# Patient Record
Sex: Male | Born: 1960 | Race: Black or African American | Hispanic: No | Marital: Single | Smoking: Current every day smoker
Health system: Southern US, Community
[De-identification: ages and names within clinical notes are randomized; demographics above are authoritative.]

## PROBLEM LIST (undated history)

## (undated) DIAGNOSIS — I10 Essential (primary) hypertension: Secondary | ICD-10-CM

## (undated) DIAGNOSIS — K219 Gastro-esophageal reflux disease without esophagitis: Secondary | ICD-10-CM

## (undated) HISTORY — DX: Gastro-esophageal reflux disease without esophagitis: K21.9

## (undated) HISTORY — DX: Essential (primary) hypertension: I10

---

## 2004-07-05 HISTORY — PX: WRIST ARTHROPLASTY: SHX1088

## 2009-07-01 ENCOUNTER — Ambulatory Visit: Payer: Self-pay | Admitting: Diagnostic Radiology

## 2009-07-01 ENCOUNTER — Emergency Department (HOSPITAL_BASED_OUTPATIENT_CLINIC_OR_DEPARTMENT_OTHER): Admission: EM | Admit: 2009-07-01 | Discharge: 2009-07-01 | Payer: Self-pay | Admitting: Emergency Medicine

## 2012-01-10 ENCOUNTER — Emergency Department (HOSPITAL_BASED_OUTPATIENT_CLINIC_OR_DEPARTMENT_OTHER): Payer: 59

## 2012-01-10 ENCOUNTER — Encounter (HOSPITAL_BASED_OUTPATIENT_CLINIC_OR_DEPARTMENT_OTHER): Payer: Self-pay | Admitting: *Deleted

## 2012-01-10 ENCOUNTER — Emergency Department (HOSPITAL_BASED_OUTPATIENT_CLINIC_OR_DEPARTMENT_OTHER)
Admission: EM | Admit: 2012-01-10 | Discharge: 2012-01-11 | Disposition: A | Discharge status: Home / Self Care | Payer: 59 | Attending: Emergency Medicine | Admitting: Emergency Medicine

## 2012-01-10 DIAGNOSIS — F17200 Nicotine dependence, unspecified, uncomplicated: Secondary | ICD-10-CM | POA: Insufficient documentation

## 2012-01-10 DIAGNOSIS — R0602 Shortness of breath: Secondary | ICD-10-CM | POA: Insufficient documentation

## 2012-01-10 DIAGNOSIS — R079 Chest pain, unspecified: Secondary | ICD-10-CM | POA: Insufficient documentation

## 2012-01-10 DIAGNOSIS — M549 Dorsalgia, unspecified: Secondary | ICD-10-CM | POA: Insufficient documentation

## 2012-01-10 DIAGNOSIS — R091 Pleurisy: Secondary | ICD-10-CM | POA: Insufficient documentation

## 2012-01-10 LAB — CBC WITH DIFFERENTIAL/PLATELET
Basophils Absolute: 0 10*3/uL (ref 0.0–0.1)
Eosinophils Relative: 2 % (ref 0–5)
Lymphocytes Relative: 36 % (ref 12–46)
MCV: 97.2 fL (ref 78.0–100.0)
Platelets: 191 10*3/uL (ref 150–400)
RDW: 13.2 % (ref 11.5–15.5)
WBC: 8.1 10*3/uL (ref 4.0–10.5)

## 2012-01-10 LAB — BASIC METABOLIC PANEL
CO2: 27 mEq/L (ref 19–32)
Calcium: 9.6 mg/dL (ref 8.4–10.5)
GFR calc Af Amer: >90 mL/min (ref >90)
GFR calc non Af Amer: 87 mL/min — ABNORMAL LOW (ref >90)
Sodium: 138 mEq/L (ref 135–145)

## 2012-01-10 LAB — TROPONIN I: Troponin I: <0.3 ng/mL (ref <0.30)

## 2012-01-10 LAB — D-DIMER, QUANTITATIVE: D-Dimer, Quant: <0.22 ug/mL-FEU (ref 0.00–0.48)

## 2012-01-10 NOTE — ED Notes (Signed)
Pt. Reports no nausea or vomiting. 

## 2012-01-10 NOTE — ED Notes (Signed)
Patient states that when he inhales he has a pain in his chest on the right side, also when he coughs he has pain in his upper back. States that he hasn't been sick.

## 2012-01-10 NOTE — ED Provider Notes (Addendum)
History  This chart was scribed for Jonathan Saulters [name], MD by Jonathan Barry. This patient was seen in room MH08/MH08 and the patient's care was started at 11:56PM.  CSN: [id number]  Arrival date & time 01/10/12  1954   First MD Initiated Contact with Patient 01/10/12 2256      Chief Complaint  Patient presents with  . Pleurisy    Patient is a 51 y.o. male presenting with chest pain. The history is provided by the patient. No language interpreter was used.  Chest Pain The chest pain began 12 - 24 hours ago. Duration of episode(s) is 18 hours. Chest pain occurs constantly. The chest pain is unchanged. The pain is associated with breathing and coughing. At its most intense, the pain is at 8/10. The pain is currently at 8/10. The severity of the pain is severe. The quality of the pain is described as sharp (right chest straight through). The pain radiates to the mid back. Chest pain is worsened by deep breathing. Pertinent negatives for primary symptoms include no fever, no shortness of breath, no cough, no wheezing, no palpitations, no abdominal pain, no nausea, no vomiting, no dizziness and no altered mental status.  Pertinent negatives for associated symptoms include no numbness and no weakness. He tried NSAIDs for the symptoms. Risk factors include male gender.  Pertinent negatives for past medical history include no MI.  Procedure history is negative for cardiac catheterization.    Jonathan Barry is a 51 y.o. male who presents to the Emergency Department complaining of approximately 18 hours of gradual onset, gradually worsening, constant left sided chest pain that he woke up with. The chest pain is described as sharp and non-radiating. The pain is worse with deep breathing and rated a 7 or 8 out of 10 currently. He reports taking "pain away" and his GERD medications without improvement in his symptoms. He denies having prior episodes of similar symptoms. He denies any recent long distance  car/plane travels. He denies fever, leg swelling, neck pain, sore throat, visual disturbance, CP, cough, SOB, abdominal pain, nausea, emesis, diarrhea, urinary symptoms, back pain, HA, weakness, numbness and rash as associated symptoms.  He is a current everyday smoker and occasional alcohol user.   History reviewed. No pertinent past medical history.  History reviewed. No pertinent past surgical history.  No family history on file.  History  Substance Use Topics  . Smoking status: Current Everyday Smoker  . Smokeless tobacco: Not on file  . Alcohol Use: Yes      Review of Systems  Constitutional: Negative for fever and chills.  HENT: Negative for congestion, sore throat and neck pain.   Eyes: Negative for visual disturbance.  Respiratory: Negative for cough, chest tightness, shortness of breath and wheezing.   Cardiovascular: Positive for chest pain. Negative for palpitations.  Gastrointestinal: Negative for nausea, vomiting, abdominal pain and diarrhea.  Genitourinary: Negative for dysuria, urgency and hematuria.  Musculoskeletal: Negative for back pain.  Skin: Negative for rash.  Neurological: Negative for dizziness, weakness, numbness and headaches.  Psychiatric/Behavioral: Negative for altered mental status.  All other systems reviewed and are negative.    Allergies  Review of patient's allergies indicates no known allergies.  Home Medications  No current outpatient prescriptions on file.  Triage Vitals: BP 156/99  Pulse 91  Temp 98.6 F (37 C) (Oral)  Resp 18  SpO2 99%  Physical Exam  Nursing note and vitals reviewed. Constitutional: He is oriented to person, place, and time. He appears  well-developed and well-nourished. No distress.  HENT:  Head: Normocephalic and atraumatic.  Mouth/Throat: No oropharyngeal exudate.       Moist mucus membranes  Eyes: Conjunctivae and EOM are normal. Pupils are equal, round, and reactive to light. No scleral icterus.    Neck: Normal range of motion. Neck supple.  Cardiovascular: Normal rate, regular rhythm and normal heart sounds.  Exam reveals no gallop and no friction rub.   No murmur heard. Pulmonary/Chest: Effort normal and breath sounds normal. No respiratory distress. He has no wheezes. He has no rales. He exhibits no tenderness.  Abdominal: Soft. Bowel sounds are normal. He exhibits no distension. There is no tenderness. There is no rebound.  Musculoskeletal: Normal range of motion. He exhibits no tenderness.  Lymphadenopathy:    He has no cervical adenopathy.  Neurological: He is alert and oriented to person, place, and time. No cranial nerve deficit.  Skin: Skin is warm and dry.  Psychiatric: He has a normal mood and affect. His behavior is normal.    ED Course  Procedures (including critical care time)  DIAGNOSTIC STUDIES: Oxygen Saturation is 99% on room air, normal by my interpretation.    COORDINATION OF CARE: 11:05PM-Discussed treatment plan which includes blood work with pt at bedside and pt agreed to plan. Informed pt of normal CXR and EKG and pt acknowledged results.    Date: 01/10/2012  Rate: 82  Rhythm: normal sinus rhythm  QRS Axis: normal  Intervals: normal  ST/T Wave abnormalities: normal and nonspecific ST/T changes  Conduction Disutrbances:none  Narrative Interpretation: early repolarization   Old EKG Reviewed: none available   Labs Reviewed - No data to display Dg Chest 2 View  01/10/2012  *RADIOLOGY REPORT*  Clinical Data: Right-sided chest pain, shortness of breath  CHEST - 2 VIEW  Comparison: None.  Findings: No active infiltrate or effusion is seen.  Mediastinal contours appear normal.  The heart is within normal limits in size. No bony abnormality is seen.  IMPRESSION: No active lung disease.  Original Report Authenticated By: Juline Patch, M.D.    No diagnosis found.    MDM  Constant pleuritic CP x greater than 12 hours with one normal EKG and one troponin  sufficient to diagnosis as ACS.  Negative DDIMEr no need for further imaging symptoms consistent with pleurisy. Return if develop fevers, SOB, emesis and diaphoresis. Advised pt to follow up with PCP.  Patient verbalizes understanding and agrees to follow up    I personally performed the services described in this documentation, which was scribed in my presence. The recorded information has been reviewed and considered.     Jonathan Awe, MD 01/11/12 0524  Jonathan Secord K Briget Shaheed-Rasch, MD 01/11/12 [phone]

## 2012-01-11 MED ORDER — NAPROXEN 375 MG PO TABS: 375.0000 mg | ORAL_TABLET | Freq: Two times a day (BID) | ORAL | Status: AC

## 2012-01-11 MED ORDER — KETOROLAC TROMETHAMINE 60 MG/2ML IM SOLN
60.0000 mg | Freq: Once | INTRAMUSCULAR | Status: AC
  Administered 2012-01-11: 60 mg via INTRAMUSCULAR
  Filled 2012-01-11: qty 2

## 2013-06-12 ENCOUNTER — Encounter: Payer: Self-pay | Admitting: Internal Medicine

## 2013-07-12 ENCOUNTER — Encounter: Payer: Self-pay | Admitting: Internal Medicine

## 2013-07-16 ENCOUNTER — Ambulatory Visit: Payer: 59 | Admitting: Internal Medicine

## 2013-08-01 ENCOUNTER — Encounter: Payer: Self-pay | Admitting: Internal Medicine

## 2013-08-28 ENCOUNTER — Encounter: Payer: Self-pay | Admitting: Internal Medicine

## 2013-08-29 ENCOUNTER — Ambulatory Visit: Payer: 59 | Admitting: Internal Medicine

## 2013-09-11 ENCOUNTER — Encounter: Payer: Self-pay | Admitting: Internal Medicine

## 2013-10-18 ENCOUNTER — Encounter: Payer: Self-pay | Admitting: Internal Medicine

## 2013-10-23 ENCOUNTER — Ambulatory Visit: Payer: 59 | Admitting: Internal Medicine

## 2013-11-03 ENCOUNTER — Encounter (HOSPITAL_BASED_OUTPATIENT_CLINIC_OR_DEPARTMENT_OTHER): Payer: Self-pay | Admitting: Emergency Medicine

## 2013-11-03 ENCOUNTER — Emergency Department (HOSPITAL_BASED_OUTPATIENT_CLINIC_OR_DEPARTMENT_OTHER): Payer: 59

## 2013-11-03 ENCOUNTER — Emergency Department (HOSPITAL_BASED_OUTPATIENT_CLINIC_OR_DEPARTMENT_OTHER)
Admission: EM | Admit: 2013-11-03 | Discharge: 2013-11-04 | Disposition: A | Discharge status: Home / Self Care | Payer: 59 | Attending: Emergency Medicine | Admitting: Emergency Medicine

## 2013-11-03 DIAGNOSIS — Z23 Encounter for immunization: Secondary | ICD-10-CM | POA: Insufficient documentation

## 2013-11-03 DIAGNOSIS — I10 Essential (primary) hypertension: Secondary | ICD-10-CM | POA: Insufficient documentation

## 2013-11-03 DIAGNOSIS — F17200 Nicotine dependence, unspecified, uncomplicated: Secondary | ICD-10-CM | POA: Insufficient documentation

## 2013-11-03 DIAGNOSIS — S82109A Unspecified fracture of upper end of unspecified tibia, initial encounter for closed fracture: Secondary | ICD-10-CM | POA: Insufficient documentation

## 2013-11-03 DIAGNOSIS — S82143A Displaced bicondylar fracture of unspecified tibia, initial encounter for closed fracture: Secondary | ICD-10-CM

## 2013-11-03 DIAGNOSIS — Z8719 Personal history of other diseases of the digestive system: Secondary | ICD-10-CM | POA: Insufficient documentation

## 2013-11-03 MED ORDER — HYDROCODONE-ACETAMINOPHEN 5-325 MG PO TABS
2.0000 | ORAL_TABLET | Freq: Once | ORAL | Status: AC
  Administered 2013-11-03: 2 via ORAL
  Filled 2013-11-03: qty 2
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
## 2013-11-04 MED ORDER — TETANUS-DIPHTH-ACELL PERTUSSIS 5-2.5-18.5 LF-MCG/0.5 IM SUSP
0.5000 mL | Freq: Once | INTRAMUSCULAR | Status: AC
  Administered 2013-11-04: 0.5 mL via INTRAMUSCULAR
  Filled 2013-11-04: qty 0.5

## 2013-11-04 MED ORDER — OXYCODONE-ACETAMINOPHEN 5-325 MG PO TABS: 2.0000 | ORAL_TABLET | ORAL | Status: DC | PRN

## 2013-11-04 NOTE — ED Notes (Addendum)
Rx x 1 given for percocet- crutches and ice pack given for home use- work note given- family member at bedside to drive

## 2013-11-04 NOTE — Discharge Instructions (Signed)
Knee Fracture, Adult °A knee fracture is a break in any of the bones of the lower part of the thigh bone, the upper part of the bones of the lower leg, or of the kneecap. When the bones no longer meet the way they are supposed to it is called a dislocation. Sometimes there can be a dislocation along with fractures. °SYMPTOMS  °Symptoms may include pain, swelling, inability to bend the knee, deformity of the knee, and inability to walk.  °DIAGNOSIS  °This problem is usually diagnosed with x-rays. Special studies are sometimes done if a fracture is suspected but cannot be seen on ordinary x-rays. If vessels around the knee are injured, special tests may be done to see what the damage is. °TREATMENT  °· The leg is usually splinted for the first couple of days to allow for swelling. After the swelling is down a cast is put on. Sometimes a cast is put on right away with the sides of the cast cut to allow the knee to swell. If the bones are in place, this may be all that is needed. °· If the bones are out of place, medications for pain are given to allow them to be put back in place. If they are seriously out of place, surgery may be needed to hold the pieces or breaks in place using wires, pins, screws or metal plates. °· Generally most fractures will heal in 4 to 6 weeks. °HOME CARE INSTRUCTIONS  °· Use your crutches as directed. °· To lessen the swelling, keep the injured leg elevated while sitting or lying down. °· Apply ice to the injury for 15-20 minutes, 03-04 times per day while awake for 2 days. Put the ice in a plastic bag and place a thin towel between the bag of ice and your cast. °· If you have a plaster or fiberglass cast: °· Do not try to scratch the skin under the cast using sharp or pointed objects. °· Check the skin around the cast every day. You may put lotion on any red or sore areas. °· Keep your cast dry and clean. °· If you have a plaster splint: °· Wear the splint as directed. °· You may loosen the  elastic around the splint if your toes become numb, tingle, or turn cold or blue. °· Do not put pressure on any part of your cast or splint; it may break. Rest your cast only on a pillow the first 24 hours until it is fully hardened. °· Your cast or splint can be protected during bathing with a plastic bag. Do not lower the cast or splint into water. °· Only take over-the-counter or prescription medicines for pain, discomfort, or fever as directed by your caregiver. °· See your caregiver soon if your cast gets damaged or breaks. °· It is very important to keep all follow up appointments. Not following up as directed may result in a worsening of your condition or a failure of the fracture to heal properly. °SEEK IMMEDIATE MEDICAL CARE IF: °· You have continued severe pain. °· You have more swelling than you did before the cast was put on. °· The area below the fracture becomes painful. °· Your skin or toenails below the injury turn blue or gray, or feel cold or numb. °· There is drainage coming from under the cast. °· New, unexplained symptoms develop (drugs used in treatment may produce side effects). °MAKE SURE YOU:  °· Understand these instructions. °· Will watch your condition. °· Will   get help right away if you are not doing well or get worse. °Document Released: 05/04/2006 Document Revised: 09/13/2011 Document Reviewed: 06/05/2007 °[employer]® Patient Information ©2014 [employer]. ° °

## 2013-11-04 NOTE — ED Provider Notes (Signed)
Medical screening examination/treatment/procedure(s) were performed by non-physician practitioner and as supervising physician I was immediately available for consultation/collaboration.    Laikynn Pollio L Dayra Rapley, MD 11/04/13 1521 

## 2013-11-04 NOTE — ED Notes (Signed)
I completed wound care on both knees, then wrap of ace over sock material, then wraps of ace medium and large widths. I applied immobilizer over ace to complete.

## 2013-11-27 NOTE — H&P (Signed)
  [facility] [address]   [address] [place], [place] [address] [phone] A Division of [facility]  Loreta Ave, M.D.   Robert A. Thurston Hole, M.D.   Burnell Blanks, M.D.   Eulas Post, M.D.   Lunette Stands, M.D Jewel Baize. Eulah Pont, M.D.  Buford Dresser, M.D.  Estell Harpin, M.D.    Melina Fiddler, M.D. Mary L. Isidoro Donning, PA-C  Kirstin A. Shepperson, PA-C  Josh [place], PA-C [place], [place]   RE: Jonathan Barry, Jonathan Barry   [id number]      DOB: 01-03-1961 PROGRESS NOTE: 11-21-13 Reason for visit:  Follow-up MRI left knee. Date of injury:11/04/13 when he slipped down a loose step.  History of present illness: He continues to have pain and swelling in the knee with some throbbing. Pain has improved some.   Please see associated documentation for this clinic visit for further past medical, family, surgical and social history, review of systems, and exam findings as this was reviewed by me.  EXAMINATION: Well appearing male in no apparent distress. Left lower extremity has effusion, tenderness on the medial and lateral joint line, pain limits ligamentous exam. He is neurovascularly intact distally.  IMAGING: Review of MRI demonstrates small posterior horn medial meniscus tear large lateral posterior horn meniscus tear, complete tear of his ACL and significant kissing lesions question of small displacement of posterior tibia, however I think this is likely his posterior slope, and significant bone bruising from pivot shift.  ASSESSMENT/PLAN: 1. I had a lengthy discussion in regards to options. 2. Given his meniscus tear I recommend partial meniscectomy and debridement.   3. He can continue weightbearing I think his plateau is stable. 4. With regards to his ACL tear I had a lengthy discussion about this. Given his age and activity if he wants to try to live without his ACL that is reasonable and we could do reconstruction at a  later date, I discussed many people are functional without it and it depends on his activity level. 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 He would like allograft ACL reconstruction as well.  Jewel Baize.  Eulah Pont, M.D.  Electronically verified by Jewel Baize. Eulah Pont, M.D. TDM:kah D 11-21-13 T 11-22-13

## 2013-12-05 ENCOUNTER — Encounter (HOSPITAL_BASED_OUTPATIENT_CLINIC_OR_DEPARTMENT_OTHER): Payer: Self-pay | Admitting: *Deleted

## 2013-12-05 NOTE — Progress Notes (Signed)
Pt says he has been off htn meds 3 months-his pcp aware and bp good

## 2013-12-07 ENCOUNTER — Encounter (HOSPITAL_BASED_OUTPATIENT_CLINIC_OR_DEPARTMENT_OTHER): Payer: Self-pay | Admitting: *Deleted

## 2013-12-07 ENCOUNTER — Ambulatory Visit (HOSPITAL_BASED_OUTPATIENT_CLINIC_OR_DEPARTMENT_OTHER): Payer: 59 | Admitting: Anesthesiology

## 2013-12-07 ENCOUNTER — Encounter (HOSPITAL_BASED_OUTPATIENT_CLINIC_OR_DEPARTMENT_OTHER)
Admission: RE | Disposition: A | Discharge status: Home / Self Care | Payer: Self-pay | Source: Ambulatory Visit | Attending: Orthopedic Surgery

## 2013-12-07 ENCOUNTER — Encounter (HOSPITAL_BASED_OUTPATIENT_CLINIC_OR_DEPARTMENT_OTHER): Payer: 59 | Admitting: Anesthesiology

## 2013-12-07 ENCOUNTER — Ambulatory Visit (HOSPITAL_BASED_OUTPATIENT_CLINIC_OR_DEPARTMENT_OTHER)
Admission: RE | Admit: 2013-12-07 | Discharge: 2013-12-07 | Disposition: A | Discharge status: Home / Self Care | Payer: 59 | Source: Ambulatory Visit | Attending: Orthopedic Surgery | Admitting: Orthopedic Surgery

## 2013-12-07 DIAGNOSIS — W108XXA Fall (on) (from) other stairs and steps, initial encounter: Secondary | ICD-10-CM | POA: Insufficient documentation

## 2013-12-07 DIAGNOSIS — I10 Essential (primary) hypertension: Secondary | ICD-10-CM | POA: Insufficient documentation

## 2013-12-07 DIAGNOSIS — S83289A Other tear of lateral meniscus, current injury, unspecified knee, initial encounter: Secondary | ICD-10-CM | POA: Insufficient documentation

## 2013-12-07 DIAGNOSIS — M2240 Chondromalacia patellae, unspecified knee: Secondary | ICD-10-CM | POA: Insufficient documentation

## 2013-12-07 DIAGNOSIS — K219 Gastro-esophageal reflux disease without esophagitis: Secondary | ICD-10-CM | POA: Insufficient documentation

## 2013-12-07 DIAGNOSIS — S83509A Sprain of unspecified cruciate ligament of unspecified knee, initial encounter: Secondary | ICD-10-CM | POA: Insufficient documentation

## 2013-12-07 DIAGNOSIS — IMO0002 Reserved for concepts with insufficient information to code with codable children: Secondary | ICD-10-CM | POA: Insufficient documentation

## 2013-12-07 DIAGNOSIS — F17200 Nicotine dependence, unspecified, uncomplicated: Secondary | ICD-10-CM | POA: Insufficient documentation

## 2013-12-07 HISTORY — PX: KNEE ARTHROSCOPY WITH ANTERIOR CRUCIATE LIGAMENT (ACL) REPAIR WITH HAMSTRING GRAFT: SHX5645

## 2013-12-07 LAB — POCT HEMOGLOBIN-HEMACUE: Hemoglobin: 16.9 g/dL (ref 13.0–17.0)

## 2013-12-07 SURGERY — KNEE ARTHROSCOPY WITH ANTERIOR CRUCIATE LIGAMENT (ACL) REPAIR WITH HAMSTRING GRAFT
Anesthesia: General | Site: Knee | Laterality: Left

## 2013-12-07 MED ORDER — ACETAMINOPHEN 500 MG PO TABS
ORAL_TABLET | ORAL | Status: AC
  Filled 2013-12-07: qty 2

## 2013-12-07 MED ORDER — MIDAZOLAM HCL 2 MG/2ML IJ SOLN
INTRAMUSCULAR | Status: AC
  Filled 2013-12-07: qty 2

## 2013-12-07 MED ORDER — OXYCODONE HCL 5 MG PO TABS
ORAL_TABLET | ORAL | Status: AC
  Filled 2013-12-07: qty 1

## 2013-12-07 MED ORDER — HYDROMORPHONE HCL PF 1 MG/ML IJ SOLN
INTRAMUSCULAR | Status: AC
  Filled 2013-12-07: qty 1

## 2013-12-07 MED ORDER — HYDROMORPHONE HCL PF 1 MG/ML IJ SOLN
0.2500 mg | INTRAMUSCULAR | Status: DC | PRN
  Administered 2013-12-07 (×4): 0.5 mg via INTRAVENOUS

## 2013-12-07 MED ORDER — OXYCODONE HCL 5 MG/5ML PO SOLN: 5.0000 mg | Freq: Once | ORAL | Status: AC | PRN

## 2013-12-07 MED ORDER — ACETAMINOPHEN 500 MG PO TABS
1000.0000 mg | ORAL_TABLET | Freq: Once | ORAL | Status: AC
  Administered 2013-12-07: 1000 mg via ORAL

## 2013-12-07 MED ORDER — FENTANYL CITRATE 0.05 MG/ML IJ SOLN
50.0000 ug | INTRAMUSCULAR | Status: DC | PRN
  Administered 2013-12-07: 100 ug via INTRAVENOUS

## 2013-12-07 MED ORDER — BUPIVACAINE-EPINEPHRINE (PF) 0.5% -1:200000 IJ SOLN
INTRAMUSCULAR | Status: DC | PRN
  Administered 2013-12-07: 30 mL via PERINEURAL

## 2013-12-07 MED ORDER — DEXAMETHASONE SODIUM PHOSPHATE 10 MG/ML IJ SOLN
INTRAMUSCULAR | Status: DC | PRN
  Administered 2013-12-07: 4 mg via INTRAVENOUS
  Administered 2013-12-07: 6 mg

## 2013-12-07 MED ORDER — FENTANYL CITRATE 0.05 MG/ML IJ SOLN
INTRAMUSCULAR | Status: AC
  Filled 2013-12-07: qty 10

## 2013-12-07 MED ORDER — FENTANYL CITRATE 0.05 MG/ML IJ SOLN
INTRAMUSCULAR | Status: DC | PRN
  Administered 2013-12-07 (×2): 25 ug via INTRAVENOUS
  Administered 2013-12-07: 50 ug via INTRAVENOUS
  Administered 2013-12-07: 100 ug via INTRAVENOUS

## 2013-12-07 MED ORDER — MIDAZOLAM HCL 2 MG/2ML IJ SOLN
1.0000 mg | INTRAMUSCULAR | Status: DC | PRN
  Administered 2013-12-07: 2 mg via INTRAVENOUS

## 2013-12-07 MED ORDER — DOCUSATE SODIUM 100 MG PO CAPS: 100.0000 mg | ORAL_CAPSULE | Freq: Two times a day (BID) | ORAL | Status: DC

## 2013-12-07 MED ORDER — CEFAZOLIN SODIUM-DEXTROSE 2-3 GM-% IV SOLR
2.0000 g | INTRAVENOUS | Status: AC
  Administered 2013-12-07: 2 g via INTRAVENOUS

## 2013-12-07 MED ORDER — ONDANSETRON HCL 4 MG PO TABS: 4.0000 mg | ORAL_TABLET | Freq: Three times a day (TID) | ORAL | Status: DC | PRN

## 2013-12-07 MED ORDER — LACTATED RINGERS IV SOLN
INTRAVENOUS | Status: DC
  Administered 2013-12-07 (×3): via INTRAVENOUS

## 2013-12-07 MED ORDER — FENTANYL CITRATE 0.05 MG/ML IJ SOLN
INTRAMUSCULAR | Status: AC
  Filled 2013-12-07: qty 2

## 2013-12-07 MED ORDER — ONDANSETRON HCL 4 MG/2ML IJ SOLN
INTRAMUSCULAR | Status: DC | PRN
  Administered 2013-12-07: 4 mg via INTRAVENOUS

## 2013-12-07 MED ORDER — CEFAZOLIN SODIUM-DEXTROSE 2-3 GM-% IV SOLR
INTRAVENOUS | Status: AC
  Filled 2013-12-07: qty 50

## 2013-12-07 MED ORDER — DEXTROSE-NACL 5-0.45 % IV SOLN: 100.0000 mL/h | INTRAVENOUS | Status: DC

## 2013-12-07 MED ORDER — OXYCODONE HCL 5 MG PO TABS
10.0000 mg | ORAL_TABLET | ORAL | Status: AC | PRN
Start: 2013-12-07 — End: ?

## 2013-12-07 MED ORDER — OXYCODONE HCL 5 MG PO TABS
5.0000 mg | ORAL_TABLET | Freq: Once | ORAL | Status: AC | PRN
  Administered 2013-12-07: 5 mg via ORAL

## 2013-12-07 MED ORDER — SODIUM CHLORIDE 0.9 % IR SOLN
Status: DC | PRN
  Administered 2013-12-07: 6000 mL

## 2013-12-07 MED ORDER — ASPIRIN 81 MG PO TABS: 81.0000 mg | ORAL_TABLET | Freq: Every day | ORAL | Status: DC

## 2013-12-07 MED ORDER — PROPOFOL 10 MG/ML IV BOLUS
INTRAVENOUS | Status: DC | PRN
  Administered 2013-12-07: 200 mg via INTRAVENOUS

## 2013-12-07 MED ORDER — PROMETHAZINE HCL 25 MG/ML IJ SOLN: 6.2500 mg | INTRAMUSCULAR | Status: DC | PRN

## 2013-12-07 SURGICAL SUPPLY — 88 items
ANCHOR BUTTON TIGHTROPE BTB (Anchor) ×3 IMPLANT
BANDAGE ELASTIC 6 VELCRO ST LF (GAUZE/BANDAGES/DRESSINGS) ×3 IMPLANT
BANDAGE ESMARK 6X9 LF (GAUZE/BANDAGES/DRESSINGS) ×1 IMPLANT
BENZOIN TINCTURE PRP APPL 2/3 (GAUZE/BANDAGES/DRESSINGS) ×3 IMPLANT
BLADE 15 SAFETY STRL DISP (BLADE) ×3 IMPLANT
BLADE 4.2CUDA (BLADE) IMPLANT
BLADE CUDA 5.5 (BLADE) IMPLANT
BLADE CUDA GRT WHITE 3.5 (BLADE) IMPLANT
BLADE CUTTER GATOR 3.5 (BLADE) ×3 IMPLANT
BLADE CUTTER MENIS 5.5 (BLADE) IMPLANT
BLADE GREAT WHITE 4.2 (BLADE) ×2 IMPLANT
BLADE GREAT WHITE 4.2MM (BLADE) ×1
BNDG ESMARK 6X9 LF (GAUZE/BANDAGES/DRESSINGS) ×3
BUR OVAL 4.0 (BURR) IMPLANT
BUR OVAL 6.0 (BURR) ×3 IMPLANT
CANISTER SUCT 3000ML (MISCELLANEOUS) IMPLANT
CHLORAPREP W/TINT 26ML (MISCELLANEOUS) ×3 IMPLANT
CLOSURE WOUND 1/2 X4 (GAUZE/BANDAGES/DRESSINGS) ×1
COVER TABLE BACK 60X90 (DRAPES) ×3 IMPLANT
CUFF TOURNIQUET SINGLE 34IN LL (TOURNIQUET CUFF) ×3 IMPLANT
CUTTER MENISCUS  4.2MM (BLADE)
CUTTER MENISCUS 4.2MM (BLADE) IMPLANT
DECANTER SPIKE VIAL GLASS SM (MISCELLANEOUS) IMPLANT
DRAPE ARTHROSCOPY W/POUCH 114 (DRAPES) ×3 IMPLANT
DRAPE U-SHAPE 47X51 STRL (DRAPES) ×3 IMPLANT
DRILL FLIPCUTTER II 8.5MM (INSTRUMENTS) ×1 IMPLANT
DRSG EMULSION OIL 3X3 NADH (GAUZE/BANDAGES/DRESSINGS) ×3 IMPLANT
ELECT REM PT RETURN 9FT ADLT (ELECTROSURGICAL) ×3
ELECTRODE REM PT RTRN 9FT ADLT (ELECTROSURGICAL) ×1 IMPLANT
FIBERSTICK 2 (SUTURE) ×3 IMPLANT
FLIPCUTTER II 8.5MM (INSTRUMENTS) ×3
GAUZE SPONGE 4X4 12PLY STRL (GAUZE/BANDAGES/DRESSINGS) ×6 IMPLANT
GLOVE BIO SURGEON STRL SZ7.5 (GLOVE) ×3 IMPLANT
GLOVE BIO SURGEON STRL SZ8 (GLOVE) ×3 IMPLANT
GLOVE BIOGEL PI IND STRL 7.0 (GLOVE) ×1 IMPLANT
GLOVE BIOGEL PI IND STRL 8 (GLOVE) ×2 IMPLANT
GLOVE BIOGEL PI INDICATOR 7.0 (GLOVE) ×2
GLOVE BIOGEL PI INDICATOR 8 (GLOVE) ×4
GLOVE ECLIPSE 7.0 STRL STRAW (GLOVE) ×3 IMPLANT
GLOVE ORTHO TXT STRL SZ7.5 (GLOVE) IMPLANT
GOWN STRL REUS W/ TWL LRG LVL3 (GOWN DISPOSABLE) ×3 IMPLANT
GOWN STRL REUS W/ TWL XL LVL3 (GOWN DISPOSABLE) ×1 IMPLANT
GOWN STRL REUS W/TWL LRG LVL3 (GOWN DISPOSABLE) ×6
GOWN STRL REUS W/TWL XL LVL3 (GOWN DISPOSABLE) ×2
IMMOBILIZER KNEE 22 UNIV (SOFTGOODS) IMPLANT
IMMOBILIZER KNEE 24 THIGH 36 (MISCELLANEOUS) ×1 IMPLANT
IMMOBILIZER KNEE 24 UNIV (MISCELLANEOUS) ×3
KIT BUTTON TIGHTROPE ABS 8X12 (Anchor) ×3 IMPLANT
KIT TRANSTIBIAL (DISPOSABLE) ×3 IMPLANT
KNEE WRAP E Z 3 GEL PACK (MISCELLANEOUS) ×3 IMPLANT
MANIFOLD NEPTUNE II (INSTRUMENTS) ×3 IMPLANT
NS IRRIG 1000ML POUR BTL (IV SOLUTION) ×3 IMPLANT
PACK ARTHROSCOPY DSU (CUSTOM PROCEDURE TRAY) ×3 IMPLANT
PACK BASIN DAY SURGERY FS (CUSTOM PROCEDURE TRAY) ×3 IMPLANT
PAD CAST 4YDX4 CTTN HI CHSV (CAST SUPPLIES) ×1 IMPLANT
PADDING CAST COTTON 4X4 STRL (CAST SUPPLIES) ×2
PADDING CAST COTTON 6X4 STRL (CAST SUPPLIES) ×3 IMPLANT
PENCIL BUTTON HOLSTER BLD 10FT (ELECTRODE) ×3 IMPLANT
SET ARTHROSCOPY TUBING (MISCELLANEOUS) ×2
SET ARTHROSCOPY TUBING LN (MISCELLANEOUS) ×1 IMPLANT
SLEEVE SCD COMPRESS KNEE MED (MISCELLANEOUS) ×3 IMPLANT
SPONGE LAP 4X18 X RAY DECT (DISPOSABLE) ×3 IMPLANT
STRIP CLOSURE SKIN 1/2X4 (GAUZE/BANDAGES/DRESSINGS) ×2 IMPLANT
SUCTION FRAZIER TIP 10 FR DISP (SUCTIONS) ×3 IMPLANT
SUT 2 FIBERLOOP 20 STRT BLUE (SUTURE) ×6
SUT ETHILON 3 0 PS 1 (SUTURE) ×3 IMPLANT
SUT FIBERWIRE #2 38 T-5 BLUE (SUTURE)
SUT MNCRL AB 4-0 PS2 18 (SUTURE) ×3 IMPLANT
SUT MON AB 2-0 CT1 36 (SUTURE) ×3 IMPLANT
SUT PROLENE 3 0 PS 2 (SUTURE) IMPLANT
SUT VIC AB 0 SH 27 (SUTURE) ×3 IMPLANT
SUT VIC AB 2-0 SH 27 (SUTURE) ×2
SUT VIC AB 2-0 SH 27XBRD (SUTURE) ×1 IMPLANT
SUT VIC AB 3-0 SH 27 (SUTURE)
SUT VIC AB 3-0 SH 27X BRD (SUTURE) IMPLANT
SUT VICRYL 4-0 PS2 18IN ABS (SUTURE) IMPLANT
SUTURE 2 FIBERLOOP 20 STRT BLU (SUTURE) ×2 IMPLANT
SUTURE FIBERWR #2 38 T-5 BLUE (SUTURE) IMPLANT
SUTURE TIGERSTICK 2 TIGERWIR 2 (MISCELLANEOUS) ×1 IMPLANT
TAPE CLOTH 3X10 TAN LF (GAUZE/BANDAGES/DRESSINGS) ×3 IMPLANT
TIGERSTICK 2 TIGERWIRE 2 (MISCELLANEOUS) ×3
TISSUE GRAFTLINK FGL (Tissue) ×3 IMPLANT
TOWEL OR 17X24 6PK STRL BLUE (TOWEL DISPOSABLE) ×3 IMPLANT
TOWEL OR NON WOVEN STRL DISP B (DISPOSABLE) ×3 IMPLANT
TightRope ABS (Graft) ×3 IMPLANT
TightRope ABS (Orthopedic Implant) IMPLANT
WAND STAR VAC 90 (SURGICAL WAND) ×3 IMPLANT
WATER STERILE IRR 1000ML POUR (IV SOLUTION) ×3 IMPLANT

## 2013-12-07 NOTE — Discharge Instructions (Signed)
Wear your knee immobilizer when out of bed  Take ASA 81 daily  Post Anesthesia Home Care Instructions  Activity: Get plenty of rest for the remainder of the day. A responsible adult should stay with you for 24 hours following the procedure.  For the next 24 hours, DO NOT: -Drive a car -Advertising copywriter -Drink alcoholic beverages -Take any medication unless instructed by your physician -Make any legal decisions or sign important papers.  Meals: Start with liquid foods such as gelatin or soup. Progress to regular foods as tolerated. Avoid greasy, spicy, heavy foods. If nausea and/or vomiting occur, drink only clear liquids until the nausea and/or vomiting subsides. Call your physician if vomiting continues.  Special Instructions/Symptoms: Your throat may feel dry or sore from the anesthesia or the breathing tube placed in your throat during surgery. If this causes discomfort, gargle with warm salt water. The discomfort should disappear within 24 hours.  Regional Anesthesia Blocks  1. Numbness or the inability to move the "blocked" extremity may last from 3-48 hours after placement. The length of time depends on the medication injected and your individual response to the medication. If the numbness is not going away after 48 hours, call your surgeon.  2. The extremity that is blocked will need to be protected until the numbness is gone and the  Strength has returned. Because you cannot feel it, you will need to take extra care to avoid injury. Because it may be weak, you may have difficulty moving it or using it. You may not know what position it is in without looking at it while the block is in effect.  3. For blocks in the legs and feet, returning to weight bearing and walking needs to be done carefully. You will need to wait until the numbness is entirely gone and the strength has returned. You should be able to move your leg and foot normally before you try and bear weight or walk. You  will need someone to be with you when you first try to ensure you do not fall and possibly risk injury.  4. Bruising and tenderness at the needle site are common side effects and will resolve in a few days.  5. Persistent numbness or new problems with movement should be communicated to the surgeon or the [facility] [phone] [facility] [phone]).  Call your surgeon if you experience:   1.  Fever over 101.0. 2.  Inability to urinate. 3.  Nausea and/or vomiting. 4.  Extreme swelling or bruising at the surgical site. 5.  Continued bleeding from the incision. 6.  Increased pain, redness or drainage from the incision. 7.  Problems related to your pain medication.

## 2013-12-07 NOTE — Op Note (Signed)
12/07/2013  2:44 PM  PATIENT:  Jonathan Barry    PRE-OPERATIVE DIAGNOSIS:  Chondromalacia of patella  Other tear of cartilage or meniscus of knee, current   POST-OPERATIVE DIAGNOSIS:  Same  PROCEDURE:  LEFT KNEE ARTHROSCOPY WITH ANTERIOR CRUCIATE LIGAMENT (ACL) REPAIR WITH DEBRIDEMENT/SHAVING (CHONDROPLASTY) WITH MENISCECTOMY MEDIAL AND LATERAL  SURGEON:  Sheral Apleyimothy D Shantoria Ellwood, MD  ASSISTANT:  Janace LittenBrandon Parry OPA  ANESTHESIA:   General  PREOPERATIVE INDICATIONS:  Jonathan Barry is a  53 y.o. male with a diagnosis of Chondromalacia of patella  Other tear of cartilage or meniscus of knee, current  who failed conservative measures and elected for surgical management.    The risks benefits and alternatives were discussed with the patient preoperatively including but not limited to the risks of infection, bleeding, nerve injury, stiffness, cardiopulmonary complications, the need for revision surgery, recurrent instability, progression of arthritis, the potential for use of a allograft and related disease transmission risks, among others and the patient was willing to proceed.  .  OPERATIVE IMPLANTS: Arthrex anterior cruciate ligament Graft link dual tight rope  OPERATIVE FINDINGS: The anterior cruciate ligament was completely torn. The PCL was intact. The posterior lateral corner was intact to dial testing.  There was significant tearing of the mid body of the lateral meniscus this was a radial tear combined with a longitudinal split. The roots were stable the remaining meniscus was stable. The medial meniscus had a small undersurface tear but was stable and would require a large debridements I left this in place.   OPERATIVE PROCEDURE: The patient was brought to the operating room and placed in the supine position. General anesthesia was administered. IV antibiotics were given. The lower extremity was prepped and draped in usual sterile fashion. Exam under anesthesia demonstrated the above-named  findings. Time out was performed.  The leg was elevated and exsanguinated and the tourniquet was inflated. Incision was made over the proximal tibia.   The graft leg allograft was thawed opened and prepared for transfer to the leg.  Knee arthroscopy was then performed, and the above named findings were noted.    The anterior cruciate ligament however was torn.  I performed a partial lateral miss meniscectomy as described above. This was back to a stable root.  I then removed the previous anterior cruciate ligament stump, and performed a mild notchplasty.  The outside in guide was then applied to the appropriate position and the retro-cutter was used to drill the femoral socket. Care was taken to maintain the cortical bridge.  I then drilled the tibial tunnel using the retro-cutter, maintaining the outer cortex. All the soft tissue remnants were removed and cleaned at the aperture of the tunnel.  The passing suture was delivered through the medial portal and the through the femoral tunnel. The Endobutton was directly visualized it as it entered the femoral tunnel and flipped.   I then tensioned the anterior cruciate ligament tightrope, and deliver the graft up into the femoral tunnel. I then passed the passing stitch through the medial portal and out the tibial tunnel. I then placed the Endobutton disc within the suture and walking down to the tibia I confirm that it sat flush on the bone. I then used this to tension the graft into the knee and down into the tunnel. I directly visualize the tension of the graft. I then cycled the knee 15 times and tension the graft again. I then cycled again placed a posterior drawer at 30 and tension one last time.  At this point I performed a Lockman on the knee and found that it was very unstable to Lachman. I reinserted the camera and discovered that the Endobutton over the femur had not completely flipped and caught back into the joint. This point I elected  to the photograph out and re\re secure it. I removed the stitches from both ends of the graft I took off the Endobutton on both ends. The graft was still hole without any fraying selected to reuse it. I re\re stitch the Endobutton loop on both sides. It was prepared in a similar fashion previously. I re\re past the femoral and under direct visualization confirmed to be in the Endobutton bolus several times and it was stable. I then dumped the tibial and then and used the Endo plate to secure it as well. I cycled the knee 15 times applied tension to it with a posterior drawer and the Lachman test was then very stable.  I then cycled the knee, eliminated all of the creep, and I had excellent isometry.  Excellent fixation was achieved on both the femoral and tibial side, and the wounds were irrigated copiously and the sartorius fascia repaired with Vicryl, and the portals repaired with Monocryl with Steri-Strips and sterile gauze.  The patient was awakened and returned to PACU in stable and satisfactory condition. There were no complications and He tolerated the procedure well.  Post Operative plan: The patient will be weightbearing as tolerated in a knee immobilizer full time. If under 18 DVT prophylaxis will consist of early ambulation. If over 18 he will consist of early ambulation and aspirin 81 mg once a day.

## 2013-12-07 NOTE — Anesthesia Preprocedure Evaluation (Addendum)
Anesthesia Evaluation  Patient identified by MRN, date of birth, ID band Patient awake    Reviewed: Allergy & Precautions, H&P , NPO status , Patient's Chart, lab work & pertinent test results  History of Anesthesia Complications Negative for: history of anesthetic complications  Airway Mallampati: II TM Distance: >3 FB Neck ROM: Full    Dental  (+) Dental Advisory Given, Teeth Intact   Pulmonary Current Smoker,    Pulmonary exam normal       Cardiovascular hypertension,     Neuro/Psych negative neurological ROS  negative psych ROS   GI/Hepatic negative GI ROS, Neg liver ROS, GERD-  Medicated,  Endo/Other  negative endocrine ROS  Renal/GU negative Renal ROS     Musculoskeletal   Abdominal   Peds  Hematology   Anesthesia Other Findings   Reproductive/Obstetrics negative OB ROS                          Anesthesia Physical Anesthesia Plan  ASA: II  Anesthesia Plan: General LMA   Post-op Pain Management: MAC Combined w/ Regional for Post-op pain   Induction: Intravenous  Airway Management Planned: LMA  Additional Equipment:   Intra-op Plan:   Post-operative Plan: Extubation in OR  Informed Consent: I have reviewed the patients History and Physical, chart, labs and discussed the procedure including the risks, benefits and alternatives for the proposed anesthesia with the patient or authorized representative who has indicated his/her understanding and acceptance.   Dental advisory given  Plan Discussed with: Anesthesiologist, CRNA and Surgeon  Anesthesia Plan Comments:        Anesthesia Quick Evaluation

## 2013-12-07 NOTE — Progress Notes (Signed)
Assisted Dr. Singer with left, ultrasound guided, femoral block. Side rails up, monitors on throughout procedure. See vital signs in flow sheet. Tolerated Procedure well.  

## 2013-12-07 NOTE — Interval H&P Note (Signed)
History and Physical Interval Note:  12/07/2013 8:55 AM  Jonathan Barry  has presented today for surgery, with the diagnosis of Chondromalacia of patella  Other tear of cartilage or meniscus of knee, current   The various methods of treatment have been discussed with the patient and family. After consideration of risks, benefits and other options for treatment, the patient has consented to  Procedure(s): LEFT KNEE ARTHROSCOPY WITH ANTERIOR CRUCIATE LIGAMENT (ACL) REPAIR WITH DEBRIDEMENT/SHAVING (CHONDROPLASTY) WITH MENISCECTOMY MEDIAL AND LATERAL (Left) as a surgical intervention .  The patient's history has been reviewed, patient examined, no change in status, stable for surgery.  I have reviewed the patient's chart and labs.  Questions were answered to the patient's satisfaction.     Sheral Apley

## 2013-12-07 NOTE — Anesthesia Procedure Notes (Addendum)
Anesthesia Regional Block:  Femoral nerve block  Pre-Anesthetic Checklist: ,, timeout performed, Correct Patient, Correct Site, Correct Laterality, Correct Procedure, Correct Position, site marked, Risks and benefits discussed,  Surgical consent,  Pre-op evaluation,  At surgeon's request and post-op pain management  Laterality: Left  Prep: chloraprep       Needles:  Injection technique: Single-shot  Needle Type: Echogenic Stimulator Needle     Needle Length: 5cm 5 cm Needle Gauge: 22 and 22 G    Additional Needles:  Procedures: ultrasound guided (picture in chart) and nerve stimulator Femoral nerve block  Nerve Stimulator or Paresthesia:  Response: quadraceps contraction, 0.45 mA,   Additional Responses:   Narrative:  Start time: 12/07/2013 11:59 AM End time: 12/07/2013 12:08 PM Injection made incrementally with aspirations every 5 mL.  Performed by: Personally  Anesthesiologist: Halford Decamp, MD  Additional Notes: Functioning IV was confirmed and monitors were applied.  A 25mm 22ga Arrow echogenic stimulator needle was used. Sterile prep and drape,hand hygiene and sterile gloves were used. Ultrasound guidance: relevant anatomy identified, needle position confirmed, local anesthetic spread visualized around nerve(s)., vascular puncture avoided.  Image printed for medical record. Negative aspiration and negative test dose prior to incremental administration of local anesthetic. The patient tolerated the procedure well.     Procedure Name: LMA Insertion Date/Time: 12/07/2013 1:01 PM Performed by: Curly [place] Pre-anesthesia Checklist: Patient identified, Emergency Drugs available, Suction available and Patient being monitored Patient Re-evaluated:Patient Re-evaluated prior to inductionOxygen Delivery Method: Circle System Utilized Preoxygenation: Pre-oxygenation with 100% oxygen Intubation Type: IV induction Ventilation: Mask ventilation without difficulty LMA: LMA  inserted LMA Size: 5.0 Number of attempts: 1 Airway Equipment and Method: bite block Placement Confirmation: positive ETCO2 and breath sounds checked- equal and bilateral Tube secured with: Tape Dental Injury: Teeth and Oropharynx as per pre-operative assessment

## 2013-12-07 NOTE — Transfer of Care (Signed)
Immediate Anesthesia Transfer of Care Note  Patient: Emon Faciane  Procedure(s) Performed: Procedure(s): LEFT KNEE ARTHROSCOPY WITH ANTERIOR CRUCIATE LIGAMENT (ACL) REPAIR WITH DEBRIDEMENT/SHAVING (CHONDROPLASTY) WITH MENISCECTOMY MEDIAL AND LATERAL (Left)  Patient Location: PACU  Anesthesia Type:GA combined with regional for post-op pain  Level of Consciousness: awake, alert  and oriented  Airway & Oxygen Therapy: Patient Spontanous Breathing and Patient connected to face mask oxygen  Post-op Assessment: Report given to PACU RN, Post -op Vital signs reviewed and stable and Patient moving all extremities  Post vital signs: Reviewed and stable  Complications: No apparent anesthesia complications

## 2013-12-07 NOTE — Anesthesia Postprocedure Evaluation (Signed)
Anesthesia Post Note  Patient: Jonathan Barry  Procedure(s) Performed: Procedure(s) (LRB): LEFT KNEE ARTHROSCOPY WITH ANTERIOR CRUCIATE LIGAMENT (ACL) REPAIR WITH DEBRIDEMENT/SHAVING (CHONDROPLASTY) WITH MENISCECTOMY MEDIAL AND LATERAL (Left)  Anesthesia type: general  Patient location: PACU  Post pain: Pain level controlled  Post assessment: Patient's Cardiovascular Status Stable  Last Vitals:  Filed Vitals:   12/07/13 1620  BP: 159/95  Pulse: 91  Temp:   Resp: 14    Post vital signs: Reviewed and stable  Level of consciousness: sedated  Complications: No apparent anesthesia complications

## 2013-12-10 ENCOUNTER — Encounter (HOSPITAL_BASED_OUTPATIENT_CLINIC_OR_DEPARTMENT_OTHER): Payer: Self-pay | Admitting: Orthopedic Surgery

## 2014-03-03 ENCOUNTER — Encounter (HOSPITAL_BASED_OUTPATIENT_CLINIC_OR_DEPARTMENT_OTHER): Payer: Self-pay | Admitting: Emergency Medicine

## 2014-03-03 ENCOUNTER — Emergency Department (HOSPITAL_BASED_OUTPATIENT_CLINIC_OR_DEPARTMENT_OTHER)
Admission: EM | Admit: 2014-03-03 | Discharge: 2014-03-03 | Disposition: A | Discharge status: Home / Self Care | Payer: 59 | Attending: Emergency Medicine | Admitting: Emergency Medicine

## 2014-03-03 ENCOUNTER — Other Ambulatory Visit (HOSPITAL_BASED_OUTPATIENT_CLINIC_OR_DEPARTMENT_OTHER): Payer: 59

## 2014-03-03 DIAGNOSIS — F17200 Nicotine dependence, unspecified, uncomplicated: Secondary | ICD-10-CM | POA: Diagnosis not present

## 2014-03-03 DIAGNOSIS — M7989 Other specified soft tissue disorders: Secondary | ICD-10-CM | POA: Insufficient documentation

## 2014-03-03 DIAGNOSIS — Z7982 Long term (current) use of aspirin: Secondary | ICD-10-CM | POA: Diagnosis not present

## 2014-03-03 DIAGNOSIS — I10 Essential (primary) hypertension: Secondary | ICD-10-CM | POA: Diagnosis not present

## 2014-03-03 DIAGNOSIS — K219 Gastro-esophageal reflux disease without esophagitis: Secondary | ICD-10-CM | POA: Diagnosis not present

## 2014-03-03 DIAGNOSIS — Z79899 Other long term (current) drug therapy: Secondary | ICD-10-CM | POA: Diagnosis not present

## 2014-03-03 DIAGNOSIS — M79661 Pain in right lower leg: Secondary | ICD-10-CM

## 2014-03-03 MED ORDER — OXYCODONE-ACETAMINOPHEN 5-325 MG PO TABS: 1.0000 | ORAL_TABLET | ORAL | Status: DC | PRN

## 2014-03-03 MED ORDER — ENOXAPARIN SODIUM 80 MG/0.8ML ~~LOC~~ SOLN
1.0000 mg/kg | Freq: Once | SUBCUTANEOUS | Status: AC
  Administered 2014-03-03: 80 mg via SUBCUTANEOUS
  Filled 2014-03-03: qty 0.8

## 2014-03-03 MED ORDER — OXYCODONE-ACETAMINOPHEN 5-325 MG PO TABS
1.0000 | ORAL_TABLET | Freq: Once | ORAL | Status: AC
  Administered 2014-03-03: 1 via ORAL
  Filled 2014-03-03: qty 1

## 2014-03-03 NOTE — Discharge Instructions (Signed)
Return tomorrow to have the ultrasound to see if you have a blood clot in your leg.  Deep Vein Thrombosis A deep vein thrombosis (DVT) is a blood clot that develops in the deep, larger veins of the leg, arm, or pelvis. These are more dangerous than clots that might form in veins near the surface of the body. A DVT can lead to serious and even life-threatening complications if the clot breaks off and travels in the bloodstream to the lungs.  A DVT can damage the valves in your leg veins so that instead of flowing upward, the blood pools in the lower leg. This is called post-thrombotic syndrome, and it can result in pain, swelling, discoloration, and sores on the leg. CAUSES Usually, several things contribute to the formation of blood clots. Contributing factors include:  The flow of blood slows down.  The inside of the vein is damaged in some way.  You have a condition that makes blood clot more easily. RISK FACTORS Some people are more likely than others to develop blood clots. Risk factors include:   Smoking.  Being overweight (obese).  Sitting or lying still for a long time. This includes long-distance travel, paralysis, or recovery from an illness or surgery. Other factors that increase risk are:   Older age, especially over 56 years of age.  Having a family history of blood clots or if you have already had a blot clot.  Having major or lengthy surgery. This is especially true for surgery on the hip, knee, or belly (abdomen). Hip surgery is particularly high risk.  Having a long, thin tube (catheter) placed inside a vein during a medical procedure.  Breaking a hip or leg.  Having cancer or cancer treatment.  Pregnancy and childbirth.  Hormone changes make the blood clot more easily during pregnancy.  The fetus puts pressure on the veins of the pelvis.  There is a risk of injury to veins during delivery or a caesarean delivery. The risk is highest just after  childbirth.  Medicines containing the male hormone estrogen. This includes birth control pills and hormone replacement therapy.  Other circulation or heart problems.  SIGNS AND SYMPTOMS When a clot forms, it can either partially or totally block the blood flow in that vein. Symptoms of a DVT can include:  Swelling of the leg or arm, especially if one side is much worse.  Warmth and redness of the leg or arm, especially if one side is much worse.  Pain in an arm or leg. If the clot is in the leg, symptoms may be more noticeable or worse when standing or walking. The symptoms of a DVT that has traveled to the lungs (pulmonary embolism, PE) usually start suddenly and include:  Shortness of breath.  Coughing.  Coughing up blood or blood-tinged mucus.  Chest pain. The chest pain is often worse with deep breaths.  Rapid heartbeat. Anyone with these symptoms should get emergency medical treatment right away. Do not wait to see if the symptoms will go away. Call your local emergency services (911 in the [place] if you have these symptoms. Do not drive yourself to the hospital. DIAGNOSIS If a DVT is suspected, your health care provider will take a full medical history and perform a physical exam. Tests that also may be required include:  Blood tests, including studies of the clotting properties of the blood.  Ultrasound to see if you have clots in your legs or lungs.  X-rays to show the flow of  blood when dye is injected into the veins (venogram).  Studies of your lungs if you have any chest symptoms. PREVENTION  Exercise the legs regularly. Take a brisk 30-minute walk every day.  Maintain a weight that is appropriate for your height.  Avoid sitting or lying in bed for long periods of time without moving your legs.  Women, particularly those over the age of 35 years, should consider the risks and benefits of taking estrogen medicines, including birth control pills.  Do not smoke,  especially if you take estrogen medicines.  Long-distance travel can increase your risk of DVT. You should exercise your legs by walking or pumping the muscles every hour.  Many of the risk factors above relate to situations that exist with hospitalization, either for illness, injury, or elective surgery. Prevention may include medical and nonmedical measures.  Your health care provider will assess you for the need for venous thromboembolism prevention when you are admitted to the hospital. If you are having surgery, your surgeon will assess you the day of or day after surgery. TREATMENT Once identified, a DVT can be treated. It can also be prevented in some circumstances. Once you have had a DVT, you may be at increased risk for a DVT in the future. The most common treatment for DVT is blood-thinning (anticoagulant) medicine, which reduces the blood's tendency to clot. Anticoagulants can stop new blood clots from forming and stop old clots from growing. They cannot dissolve existing clots. Your body does this by itself over time. Anticoagulants can be given by mouth, through an IV tube, or by injection. Your health care provider will determine the best program for you. Other medicines or treatments that may be used are:  Heparin or related medicines (low molecular weight heparin) are often the first treatment for a blood clot. They act quickly. However, they cannot be taken orally and must be given either in shot form or by IV tube.  Heparin can cause a fall in a component of blood that stops bleeding and forms blood clots (platelets). You will be monitored with blood tests to be sure this does not occur.  Warfarin is an anticoagulant that can be swallowed. It takes a few days to start working, so usually heparin or related medicines are used in combination. Once warfarin is working, heparin is usually stopped.  Factor Xa inhibitor medicines, such as rivaroxaban and apixaban, also reduce blood  clotting. These medicines are taken orally and can often be used without heparin or related medicines.  Less commonly, clot dissolving drugs (thrombolytics) are used to dissolve a DVT. They carry a high risk of bleeding, so they are used mainly in severe cases where your life or a part of your body is threatened.  Very rarely, a blood clot in the leg needs to be removed surgically.  If you are unable to take anticoagulants, your health care provider may arrange for you to have a filter placed in a main vein in your abdomen. This filter prevents clots from traveling to your lungs. HOME CARE INSTRUCTIONS  Take all medicines as directed by your health care provider.  Learn as much as you can about DVT.  Wear a medical alert bracelet or carry a medical alert card.  Ask your health care provider how soon you can go back to normal activities. It is important to stay active to prevent blood clots. If you are on anticoagulant medicine, avoid contact sports.  It is very important to exercise. This is especially  important while traveling, sitting, or standing for long periods of time. Exercise your legs by walking or by tightening and relaxing your leg muscles regularly. Take frequent walks.  You may need to wear compression stockings. These are tight elastic stockings that apply pressure to the lower legs. This pressure can help keep the blood in the legs from clotting. Taking Warfarin Warfarin is a daily medicine that is taken by mouth. Your health care provider will advise you on the length of treatment (usually 3-6 months, sometimes lifelong). If you take warfarin:  Understand how to take warfarin and foods that can affect how warfarin works in Public relations account executive.  Too much and too little warfarin are both dangerous. Too much warfarin increases the risk of bleeding. Too little warfarin continues to allow the risk for blood clots. Warfarin and Regular Blood Testing While taking warfarin, you will need to  have regular blood tests to measure your blood clotting time. These blood tests usually include both the prothrombin time (PT) and international normalized ratio (INR) tests. The PT and INR results allow your health care provider to adjust your dose of warfarin. It is very important that you have your PT and INR tested as often as directed by your health care provider.  Warfarin and Your Diet Avoid major changes in your diet, or notify your health care provider before changing your diet. Arrange a visit with a registered dietitian to answer your questions. Many foods, especially foods high in vitamin K, can interfere with warfarin and affect the PT and INR results. You should eat a consistent amount of foods high in vitamin K. Foods high in vitamin K include:   Spinach, kale, broccoli, cabbage, collard and turnip greens, Brussels sprouts, peas, cauliflower, seaweed, and parsley.  Beef and pork liver.  Green tea.  Soybean oil. Warfarin with Other Medicines Many medicines can interfere with warfarin and affect the PT and INR results. You must:  Tell your health care provider about any and all medicines, vitamins, and supplements you take, including aspirin and other over-the-counter anti-inflammatory medicines. Be especially cautious with aspirin and anti-inflammatory medicines. Ask your health care provider before taking these.  Do not take or discontinue any prescribed or over-the-counter medicine except on the advice of your health care provider or pharmacist. Warfarin Side Effects Warfarin can have side effects, such as easy bruising and difficulty stopping bleeding. Ask your health care provider or pharmacist about other side effects of warfarin. You will need to:  Hold pressure over cuts for longer than usual.  Notify your dentist and other health care providers that you are taking warfarin before you undergo any procedures where bleeding may occur. Warfarin with Alcohol and Tobacco    Drinking alcohol frequently can increase the effect of warfarin, leading to excess bleeding. It is best to avoid alcoholic drinks or to consume only very small amounts while taking warfarin. Notify your health care provider if you change your alcohol intake.   Do not use any tobacco products including cigarettes, chewing tobacco, or electronic cigarettes. If you smoke, quit. Ask your health care provider for help with quitting smoking. Alternative Medicines to Warfarin: Factor Xa Inhibitor Medicines  These blood-thinning medicines are taken by mouth, usually for several weeks or longer. It is important to take the medicine every single day at the same time each day.  There are no regular blood tests required when using these medicines.  There are fewer food and drug interactions than with warfarin.  The side effects  of this class of medicine are similar to those of warfarin, including excessive bruising or bleeding. Ask your health care provider or pharmacist about other potential side effects. SEEK MEDICAL CARE IF:  You notice a rapid heartbeat.  You feel weaker or more tired than usual.  You feel faint.  You notice increased bruising.  You feel your symptoms are not getting better in the time expected.  You believe you are having side effects of medicine. SEEK IMMEDIATE MEDICAL CARE IF:  You have chest pain.  You have trouble breathing.  You have new or increased swelling or pain in one leg.  You cough up blood.  You notice blood in vomit, in a bowel movement, or in urine. MAKE SURE YOU:  Understand these instructions.  Will watch your condition.  Will get help right away if you are not doing well or get worse. Document Released: 06/21/2005 Document Revised: 11/05/2013 Document Reviewed: 02/26/2013 [facility] Patient Information 2015 [place], [place]. This information is not intended to replace advice given to you by your health care provider. Make sure you discuss any  questions you have with your health care provider.  Acetaminophen; Oxycodone tablets What is this medicine? ACETAMINOPHEN; OXYCODONE (a set a MEE noe fen; ox i KOE done) is a pain reliever. It is used to treat mild to moderate pain. This medicine may be used for other purposes; ask your health care provider or pharmacist if you have questions. COMMON BRAND NAME(S): Endocet, Magnacet, Narvox, Percocet, Perloxx, Primalev, Primlev, Roxicet, Xolox What should I tell my health care provider before I take this medicine? They need to know if you have any of these conditions: -brain tumor -Crohn's disease, inflammatory bowel disease, or ulcerative colitis -drug abuse or addiction -head injury -heart or circulation problems -if you often drink alcohol -kidney disease or problems going to the bathroom -liver disease -lung disease, asthma, or breathing problems -an unusual or allergic reaction to acetaminophen, oxycodone, other opioid analgesics, other medicines, foods, dyes, or preservatives -pregnant or trying to get pregnant -breast-feeding How should I use this medicine? Take this medicine by mouth with a full glass of water. Follow the directions on the prescription label. Take your medicine at regular intervals. Do not take your medicine more often than directed. Talk to your pediatrician regarding the use of this medicine in children. Special care may be needed. Patients over 71 years old may have a stronger reaction and need a smaller dose. Overdosage: If you think you have taken too much of this medicine contact a poison control center or emergency room at once. NOTE: This medicine is only for you. Do not share this medicine with others. What if I miss a dose? If you miss a dose, take it as soon as you can. If it is almost time for your next dose, take only that dose. Do not take double or extra doses. What may interact with this medicine? -alcohol -antihistamines -barbiturates like  amobarbital, butalbital, butabarbital, methohexital, pentobarbital, phenobarbital, thiopental, and secobarbital -benztropine -drugs for bladder problems like solifenacin, trospium, oxybutynin, tolterodine, hyoscyamine, and methscopolamine -drugs for breathing problems like ipratropium and tiotropium -drugs for certain stomach or intestine problems like propantheline, homatropine methylbromide, glycopyrrolate, atropine, belladonna, and dicyclomine -general anesthetics like etomidate, ketamine, nitrous oxide, propofol, desflurane, enflurane, halothane, isoflurane, and sevoflurane -medicines for depression, anxiety, or psychotic disturbances -medicines for sleep -muscle relaxants -naltrexone -narcotic medicines (opiates) for pain -phenothiazines like perphenazine, thioridazine, chlorpromazine, mesoridazine, fluphenazine, prochlorperazine, promazine, and trifluoperazine -scopolamine -tramadol -trihexyphenidyl This list may  not describe all possible interactions. Give your health care provider a list of all the medicines, herbs, non-prescription drugs, or dietary supplements you use. Also tell them if you smoke, drink alcohol, or use illegal drugs. Some items may interact with your medicine. What should I watch for while using this medicine? Tell your doctor or health care professional if your pain does not go away, if it gets worse, or if you have new or a different type of pain. You may develop tolerance to the medicine. Tolerance means that you will need a higher dose of the medication for pain relief. Tolerance is normal and is expected if you take this medicine for a long time. Do not suddenly stop taking your medicine because you may develop a severe reaction. Your body becomes used to the medicine. This does NOT mean you are addicted. Addiction is a behavior related to getting and using a drug for a non-medical reason. If you have pain, you have a medical reason to take pain medicine. Your doctor  will tell you how much medicine to take. If your doctor wants you to stop the medicine, the dose will be slowly lowered over time to avoid any side effects. You may get drowsy or dizzy. Do not drive, use machinery, or do anything that needs mental alertness until you know how this medicine affects you. Do not stand or sit up quickly, especially if you are an older patient. This reduces the risk of dizzy or fainting spells. Alcohol may interfere with the effect of this medicine. Avoid alcoholic drinks. There are different types of narcotic medicines (opiates) for pain. If you take more than one type at the same time, you may have more side effects. Give your health care provider a list of all medicines you use. Your doctor will tell you how much medicine to take. Do not take more medicine than directed. Call emergency for help if you have problems breathing. The medicine will cause constipation. Try to have a bowel movement at least every 2 to 3 days. If you do not have a bowel movement for 3 days, call your doctor or health care professional. Do not take Tylenol (acetaminophen) or medicines that have acetaminophen with this medicine. Too much acetaminophen can be very dangerous. Many nonprescription medicines contain acetaminophen. Always read the labels carefully to avoid taking more acetaminophen. What side effects may I notice from receiving this medicine? Side effects that you should report to your doctor or health care professional as soon as possible: -allergic reactions like skin rash, itching or hives, swelling of the face, lips, or tongue -breathing difficulties, wheezing -confusion -light headedness or fainting spells -severe stomach pain -unusually weak or tired -yellowing of the skin or the whites of the eyes Side effects that usually do not require medical attention (report to your doctor or health care professional if they continue or are  bothersome): -dizziness -drowsiness -nausea -vomiting This list may not describe all possible side effects. Call your doctor for medical advice about side effects. You may report side effects to FDA at 1-800-FDA-1088. Where should I keep my medicine? Keep out of the reach of children. This medicine can be abused. Keep your medicine in a safe place to protect it from theft. Do not share this medicine with anyone. Selling or giving away this medicine is dangerous and against the law. Store at room temperature between 20 and 25 degrees C (68 and 77 degrees F). Keep container tightly closed. Protect from light. This medicine  may cause accidental overdose and death if it is taken by other adults, children, or pets. Flush any unused medicine down the toilet to reduce the chance of harm. Do not use the medicine after the expiration date. NOTE: This sheet is a summary. It may not cover all possible information. If you have questions about this medicine, talk to your doctor, pharmacist, or health care provider.  2015, [employer]. (2013-02-12 13:17:35)

## 2014-03-03 NOTE — ED Notes (Signed)
Pt reports right leg edema and pain x4 weeks.

## 2014-03-03 NOTE — ED Provider Notes (Addendum)
CSN: [id number]     Arrival date & time 03/03/14  0014 History  This chart was scribed for Delora Fuel, MD by Randa Evens, ED Scribe. This patient was seen in room MH11/MH11 and the patient's care was started at 12:30 AM.    Chief Complaint  Patient presents with  . Leg Swelling   The history is provided by the patient. No language interpreter was used.   HPI Comments: Jonathan Barry is a 53 y.o. male who presents to the Emergency Department complaining of gradually worsening right leg swelling onset 4 weeks prior. He states that there is pain present in the calf. He rates the pain at 7/10. He states the pain worsens with walking. He denies any injury or trauma to the leg.   Past Medical History  Diagnosis Date  . GERD (gastroesophageal reflux disease)   . Hypertension     off meds 3 months   Past Surgical History  Procedure Laterality Date  . Wrist arthroplasty  2006    tendon and artery repairs-trama-left  . Knee arthroscopy with anterior cruciate ligament (acl) repair with hamstring graft Left 12/07/2013    Procedure: LEFT KNEE ARTHROSCOPY WITH ANTERIOR CRUCIATE LIGAMENT (ACL) REPAIR WITH DEBRIDEMENT/SHAVING (CHONDROPLASTY) WITH MENISCECTOMY MEDIAL AND LATERAL;  Surgeon: Renette Butters, MD;  Location: [place];  Service: Orthopedics;  Laterality: Left;   History reviewed. No pertinent family history. History  Substance Use Topics  . Smoking status: Current Every Day Smoker -- 0.50 packs/day  . Smokeless tobacco: Not on file  . Alcohol Use: Yes     Comment: occ    Review of Systems  Cardiovascular: Positive for leg swelling.  All other systems reviewed and are negative.  Allergies  Review of patient's allergies indicates no known allergies.  Home Medications   Prior to Admission medications   Medication Sig Start Date End Date Taking? Authorizing Provider  Blood Pressure KIT by Does not apply route.   Yes Historical Provider, MD  esomeprazole  (NEXIUM) 40 MG capsule Take 40 mg by mouth daily at 12 noon.   Yes Historical Provider, MD  HYDROcodone-acetaminophen (NORCO) 10-325 MG per tablet Take 2 tablets by mouth every 4 (four) hours as needed for severe pain.   Yes Historical Provider, MD  meloxicam (MOBIC) 15 MG tablet Take 15 mg by mouth daily.   Yes Historical Provider, MD  aspirin 81 MG tablet Take 1 tablet (81 mg total) by mouth daily. 12/07/13   Renette Butters, MD  docusate sodium (COLACE) 100 MG capsule Take 1 capsule (100 mg total) by mouth 2 (two) times daily. 12/07/13   Renette Butters, MD  ondansetron (ZOFRAN) 4 MG tablet Take 1 tablet (4 mg total) by mouth every 8 (eight) hours as needed for nausea or vomiting. 12/07/13   Renette Butters, MD  oxyCODONE (OXY IR/ROXICODONE) 5 MG immediate release tablet Take 2 tablets (10 mg total) by mouth every 4 (four) hours as needed for severe pain. 12/07/13   Renette Butters, MD  oxyCODONE-acetaminophen (PERCOCET/ROXICET) 5-325 MG per tablet Take 2 tablets by mouth every 4 (four) hours as needed for severe pain. 11/04/13   Fransico Meadow, PA-C   Triage Vitals: BP 150/97  Pulse 110  Temp(Src) 98.7 F (37.1 C) (Oral)  Resp 21  Ht 5' 7"  (1.702 m)  Wt 177 lb (80.287 kg)  BMI 27.72 kg/m2  SpO2 98%  Physical Exam  Nursing note and vitals reviewed. Constitutional: He is oriented to person,  place, and time. He appears well-developed and well-nourished. No distress.  HENT:  Head: Normocephalic and atraumatic.  Eyes: Conjunctivae and EOM are normal. Pupils are equal, round, and reactive to light.  Neck: Normal range of motion. Neck supple. No JVD present. No tracheal deviation present.  Cardiovascular: Normal rate, regular rhythm and normal heart sounds.   No murmur heard. Pulmonary/Chest: Effort normal and breath sounds normal. He has no wheezes. He has no rales.  Abdominal: Soft. Bowel sounds are normal. He exhibits no distension and no mass. There is no tenderness.  Musculoskeletal: Normal  range of motion. He exhibits tenderness. He exhibits no edema.  Right calf circumference is 2cm greater than left calf circumference, moderate tenderness with positive Homan's, no chord, erythema or warmth  Lymphadenopathy:    He has no cervical adenopathy.  Neurological: He is alert and oriented to person, place, and time. He has normal reflexes. No cranial nerve deficit. Coordination normal.  Skin: Skin is warm and dry. No rash noted.  Psychiatric: He has a normal mood and affect. His behavior is normal. Thought content normal.    ED Course  Procedures (including critical care time) DIAGNOSTIC STUDIES: Oxygen Saturation is 98% on RA, normal by my interpretation.    COORDINATION OF CARE: 12:41 AM-Discussed treatment plan which includes Lovenox shot and pain medication with pt at bedside and pt agreed to plan.    MDM   Final diagnoses:  Pain and swelling of right lower leg    Swelling of the right leg of uncertain cause. Physical findings are concerning for possible DVT although his knee surgery was on the opposite side. He was given a dose of Xanax apparently and will be brought back for a venous ultrasound. He is discharged with prescription for oxycodone and acetaminophen  I personally performed the services described in this documentation, which was scribed in my presence. The recorded information has been reviewed and is accurate.       Delora Fuel, MD 19/62/22 [id number]  Delora Fuel, MD 92/11/94 1740

## 2014-03-04 ENCOUNTER — Ambulatory Visit (HOSPITAL_COMMUNITY)
Admission: RE | Admit: 2014-03-04 | Discharge: 2014-03-04 | Disposition: A | Discharge status: Home / Self Care | Payer: 59 | Source: Ambulatory Visit | Attending: Cardiology | Admitting: Cardiology

## 2014-03-04 ENCOUNTER — Other Ambulatory Visit (HOSPITAL_COMMUNITY): Payer: Self-pay | Admitting: Orthopedic Surgery

## 2014-03-04 DIAGNOSIS — M79604 Pain in right leg: Secondary | ICD-10-CM

## 2014-03-04 DIAGNOSIS — M79609 Pain in unspecified limb: Secondary | ICD-10-CM | POA: Diagnosis not present

## 2014-03-04 DIAGNOSIS — M7989 Other specified soft tissue disorders: Principal | ICD-10-CM

## 2014-03-04 NOTE — Progress Notes (Signed)
Right Lower Ext. Venous Duplex Completed.  Preliminary results by tech - Negative for DVT and SVT. There is a large, solid appearing complex mass in the medial aspect of the left calf extending from the proximal to mid calf measuring approximately 7.4x 4.7x1.9cm. Marilynne Halsted, BS, RDMS, RVT

## 2014-03-06 ENCOUNTER — Telehealth (HOSPITAL_COMMUNITY): Payer: Self-pay | Admitting: *Deleted

## 2014-03-08 ENCOUNTER — Telehealth (HOSPITAL_COMMUNITY): Payer: Self-pay | Admitting: *Deleted

## 2015-06-30 ENCOUNTER — Emergency Department (HOSPITAL_BASED_OUTPATIENT_CLINIC_OR_DEPARTMENT_OTHER): Payer: Self-pay

## 2015-06-30 ENCOUNTER — Encounter (HOSPITAL_BASED_OUTPATIENT_CLINIC_OR_DEPARTMENT_OTHER): Payer: Self-pay | Admitting: Emergency Medicine

## 2015-06-30 ENCOUNTER — Emergency Department (HOSPITAL_BASED_OUTPATIENT_CLINIC_OR_DEPARTMENT_OTHER)
Admission: EM | Admit: 2015-06-30 | Discharge: 2015-06-30 | Disposition: A | Discharge status: Home / Self Care | Payer: Self-pay | Attending: Emergency Medicine | Admitting: Emergency Medicine

## 2015-06-30 DIAGNOSIS — I10 Essential (primary) hypertension: Secondary | ICD-10-CM | POA: Insufficient documentation

## 2015-06-30 DIAGNOSIS — Y9289 Other specified places as the place of occurrence of the external cause: Secondary | ICD-10-CM | POA: Insufficient documentation

## 2015-06-30 DIAGNOSIS — Y998 Other external cause status: Secondary | ICD-10-CM | POA: Insufficient documentation

## 2015-06-30 DIAGNOSIS — Z23 Encounter for immunization: Secondary | ICD-10-CM | POA: Insufficient documentation

## 2015-06-30 DIAGNOSIS — Y9389 Activity, other specified: Secondary | ICD-10-CM | POA: Insufficient documentation

## 2015-06-30 DIAGNOSIS — S3991XA Unspecified injury of abdomen, initial encounter: Secondary | ICD-10-CM | POA: Insufficient documentation

## 2015-06-30 DIAGNOSIS — R000 Tachycardia, unspecified: Secondary | ICD-10-CM | POA: Insufficient documentation

## 2015-06-30 DIAGNOSIS — Z791 Long term (current) use of non-steroidal anti-inflammatories (NSAID): Secondary | ICD-10-CM | POA: Insufficient documentation

## 2015-06-30 DIAGNOSIS — S0181XA Laceration without foreign body of other part of head, initial encounter: Secondary | ICD-10-CM

## 2015-06-30 DIAGNOSIS — Z7982 Long term (current) use of aspirin: Secondary | ICD-10-CM | POA: Insufficient documentation

## 2015-06-30 DIAGNOSIS — K219 Gastro-esophageal reflux disease without esophagitis: Secondary | ICD-10-CM | POA: Insufficient documentation

## 2015-06-30 DIAGNOSIS — Z79899 Other long term (current) drug therapy: Secondary | ICD-10-CM | POA: Insufficient documentation

## 2015-06-30 DIAGNOSIS — F17200 Nicotine dependence, unspecified, uncomplicated: Secondary | ICD-10-CM | POA: Insufficient documentation

## 2015-06-30 LAB — COMPREHENSIVE METABOLIC PANEL
ALT: 30 U/L (ref 17–63)
AST: 50 U/L — ABNORMAL HIGH (ref 15–41)
Albumin: 4.3 g/dL (ref 3.5–5.0)
Alkaline Phosphatase: 61 U/L (ref 38–126)
Anion gap: 9 (ref 5–15)
BUN: 6 mg/dL (ref 6–20)
CO2: 24 mmol/L (ref 22–32)
CREATININE: 0.96 mg/dL (ref 0.61–1.24)
Calcium: 8.9 mg/dL (ref 8.9–10.3)
Chloride: 111 mmol/L (ref 101–111)
GFR calc Af Amer: >60 mL/min (ref >60)
GFR calc non Af Amer: >60 mL/min (ref >60)
Glucose, Bld: 103 mg/dL — ABNORMAL HIGH (ref 65–99)
Potassium: 3.2 mmol/L — ABNORMAL LOW (ref 3.5–5.1)
Sodium: 144 mmol/L (ref 135–145)
Total Bilirubin: 0.6 mg/dL (ref 0.3–1.2)
Total Protein: 7.7 g/dL (ref 6.5–8.1)

## 2015-06-30 LAB — CBC WITH DIFFERENTIAL/PLATELET
BASOS ABS: 0 10*3/uL (ref 0.0–0.1)
BASOS PCT: 0 %
EOS ABS: 0 10*3/uL (ref 0.0–0.7)
EOS PCT: 0 %
HCT: 44.7 % (ref 39.0–52.0)
HEMOGLOBIN: 15.4 g/dL (ref 13.0–17.0)
Lymphocytes Relative: 25 %
Lymphs Abs: 2.8 10*3/uL (ref 0.7–4.0)
MCH: 34 pg (ref 26.0–34.0)
MCHC: 34.5 g/dL (ref 30.0–36.0)
MCV: 98.7 fL (ref 78.0–100.0)
Monocytes Absolute: 0.9 10*3/uL (ref 0.1–1.0)
Monocytes Relative: 8 %
NEUTROS PCT: 67 %
Neutro Abs: 7.5 10*3/uL (ref 1.7–7.7)
PLATELETS: 239 10*3/uL (ref 150–400)
RBC: 4.53 MIL/uL (ref 4.22–5.81)
RDW: 13.4 % (ref 11.5–15.5)
WBC: 11.2 10*3/uL — AB (ref 4.0–10.5)

## 2015-06-30 LAB — ETHANOL: ALCOHOL ETHYL (B): 246 mg/dL — AB (ref <5)

## 2015-06-30 MED ORDER — SODIUM CHLORIDE 0.9 % IV BOLUS (SEPSIS)
500.0000 mL | Freq: Once | INTRAVENOUS | Status: AC
  Administered 2015-06-30: 500 mL via INTRAVENOUS

## 2015-06-30 MED ORDER — TETANUS-DIPHTH-ACELL PERTUSSIS 5-2.5-18.5 LF-MCG/0.5 IM SUSP
0.5000 mL | Freq: Once | INTRAMUSCULAR | Status: AC
  Administered 2015-06-30: 0.5 mL via INTRAMUSCULAR
  Filled 2015-06-30: qty 0.5

## 2015-06-30 MED ORDER — IOHEXOL 300 MG/ML  SOLN
100.0000 mL | Freq: Once | INTRAMUSCULAR | Status: AC | PRN
  Administered 2015-06-30: 100 mL via INTRAVENOUS

## 2015-06-30 MED ORDER — LIDOCAINE HCL (PF) 1 % IJ SOLN
5.0000 mL | Freq: Once | INTRAMUSCULAR | Status: AC
  Administered 2015-06-30: 5 mL
  Filled 2015-06-30: qty 5
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
## 2015-06-30 NOTE — ED Notes (Signed)
I completed wound care and gave patient extra bacitracin for home use.

## 2015-06-30 NOTE — ED Notes (Signed)
MD at bedside to suture.
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
## 2015-06-30 NOTE — ED Notes (Signed)
Pt given ice chips ok'd by MD.

## 2015-06-30 NOTE — Discharge Instructions (Signed)
Facial Laceration ° A facial laceration is a cut on the face. These injuries can be painful and cause bleeding. Lacerations usually heal quickly, but they need special care to reduce scarring. °DIAGNOSIS  °Your health care provider will take a medical history, ask for details about how the injury occurred, and examine the wound to determine how deep the cut is. °TREATMENT  °Some facial lacerations may not require closure. Others may not be able to be closed because of an increased risk of infection. The risk of infection and the chance for successful closure will depend on various factors, including the amount of time since the injury occurred. °The wound may be cleaned to help prevent infection. If closure is appropriate, pain medicines may be given if needed. Your health care provider will use stitches (sutures), wound glue (adhesive), or skin adhesive strips to repair the laceration. These tools bring the skin edges together to allow for faster healing and a better cosmetic outcome. If needed, you may also be given a tetanus shot. °HOME CARE INSTRUCTIONS °· Only take over-the-counter or prescription medicines as directed by your health care provider. °· Follow your health care provider's instructions for wound care. These instructions will vary depending on the technique used for closing the wound. °For Sutures: °· Keep the wound clean and dry.   °· If you were given a bandage (dressing), you should change it at least once a day. Also change the dressing if it becomes wet or dirty, or as directed by your health care provider.   °· Wash the wound with soap and water 2 times a day. Rinse the wound off with water to remove all soap. Pat the wound dry with a clean towel.   °· After cleaning, apply a thin layer of the antibiotic ointment recommended by your health care provider. This will help prevent infection and keep the dressing from sticking.   °· You may shower as usual after the first 24 hours. Do not soak the  wound in water until the sutures are removed.   °· Get your sutures removed as directed by your health care provider. With facial lacerations, sutures should usually be taken out after 4-5 days to avoid stitch marks.   °· Wait a few days after your sutures are removed before applying any makeup. °For Skin Adhesive Strips: °· Keep the wound clean and dry.   °· Do not get the skin adhesive strips wet. You may bathe carefully, using caution to keep the wound dry.   °· If the wound gets wet, pat it dry with a clean towel.   °· Skin adhesive strips will fall off on their own. You may trim the strips as the wound heals. Do not remove skin adhesive strips that are still stuck to the wound. They will fall off in time.   °For Wound Adhesive: °· You may briefly wet your wound in the shower or bath. Do not soak or scrub the wound. Do not swim. Avoid periods of heavy sweating until the skin adhesive has fallen off on its own. After showering or bathing, gently pat the wound dry with a clean towel.   °· Do not apply liquid medicine, cream medicine, ointment medicine, or makeup to your wound while the skin adhesive is in place. This may loosen the film before your wound is healed.   °· If a dressing is placed over the wound, be careful not to apply tape directly over the skin adhesive. This may cause the adhesive to be pulled off before the wound is healed.   °· Avoid   prolonged exposure to sunlight or tanning lamps while the skin adhesive is in place.  The skin adhesive will usually remain in place for 5-10 days, then naturally fall off the skin. Do not pick at the adhesive film.  After Healing: Once the wound has healed, cover the wound with sunscreen during the day for 1 full year. This can help minimize scarring. Exposure to ultraviolet light in the first year will darken the scar. It can take 1-2 years for the scar to lose its redness and to heal completely.  SEEK MEDICAL CARE IF:  You have a fever. SEEK IMMEDIATE  MEDICAL CARE IF:  You have redness, pain, or swelling around the wound.   You see ayellowish-white fluid (pus) coming from the wound.    This information is not intended to replace advice given to you by your health care provider. Make sure you discuss any questions you have with your health care provider.   Document Released: 07/29/2004 Document Revised: 07/12/2014 Document Reviewed: 02/01/2013 Elsevier Interactive Patient Education 2016 [employer]  General Assault Assault includes any behavior or physical attack--whether it is on purpose or not--that results in injury to another person, damage to property, or both. This also includes assault that has not yet happened, but is planned to happen. Threats of assault may be physical, verbal, or written. They may be said or sent by:  Mail.  E-mail.  Text.  Social media.  Fax. The threats may be direct, implied, or understood. WHAT ARE THE DIFFERENT FORMS OF ASSAULT? Forms of assault include:  Physically assaulting a person. This includes physical threats to inflict physical harm as well as:  Slapping.  Hitting.  Poking.  Kicking.  Punching.  Pushing.  Sexually assaulting a person. Sexual assault is any sexual activity that a person is forced, threatened, or coerced to participate in. It may or may not involve physical contact with the person who is assaulting you. You are sexually assaulted if you are forced to have sexual contact of any kind.  Damaging or destroying a person's assistive equipment, such as glasses, canes, or walkers.  Throwing or hitting objects.  Using or displaying a weapon to harm or threaten someone.  Using or displaying an object that appears to be a weapon in a threatening manner.  Using greater physical size or strength to intimidate someone.  Making intimidating or threatening gestures.  Bullying.  Hazing.  Using language that is intimidating, threatening, hostile, or  abusive.  Stalking.  Restraining someone with force. WHAT SHOULD I DO IF I EXPERIENCE ASSAULT?  Report assaults, threats, and stalking to the police. Call your local emergency services (911 in the [place] if you are in immediate danger or you need medical help.  You can work with a [employer] research associatelawyer or an advocate to get legal protection against someone who has assaulted you or threatened you with assault. Protection includes restraining orders and private addresses. Crimes against you, such as assault, can also be prosecuted through the courts. Laws will vary depending on where you live.   This information is not intended to replace advice given to you by your health care provider. Make sure you discuss any questions you have with your health care provider.   Document Released: 06/21/2005 Document Revised: 07/12/2014 Document Reviewed: 03/08/2014 Elsevier Interactive Patient Education [employer] Inc[employer]
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
## 2016-10-10 ENCOUNTER — Emergency Department (HOSPITAL_BASED_OUTPATIENT_CLINIC_OR_DEPARTMENT_OTHER): Payer: BLUE CROSS/BLUE SHIELD

## 2016-10-10 ENCOUNTER — Emergency Department (HOSPITAL_BASED_OUTPATIENT_CLINIC_OR_DEPARTMENT_OTHER)
Admission: EM | Admit: 2016-10-10 | Discharge: 2016-10-10 | Disposition: A | Discharge status: Home / Self Care | Payer: BLUE CROSS/BLUE SHIELD | Attending: Emergency Medicine | Admitting: Emergency Medicine

## 2016-10-10 ENCOUNTER — Encounter (HOSPITAL_BASED_OUTPATIENT_CLINIC_OR_DEPARTMENT_OTHER): Payer: Self-pay | Admitting: Emergency Medicine

## 2016-10-10 DIAGNOSIS — S301XXA Contusion of abdominal wall, initial encounter: Secondary | ICD-10-CM | POA: Diagnosis not present

## 2016-10-10 DIAGNOSIS — S20219A Contusion of unspecified front wall of thorax, initial encounter: Secondary | ICD-10-CM | POA: Diagnosis not present

## 2016-10-10 DIAGNOSIS — Z7982 Long term (current) use of aspirin: Secondary | ICD-10-CM | POA: Diagnosis not present

## 2016-10-10 DIAGNOSIS — S40012A Contusion of left shoulder, initial encounter: Secondary | ICD-10-CM

## 2016-10-10 DIAGNOSIS — S6000XA Contusion of unspecified finger without damage to nail, initial encounter: Secondary | ICD-10-CM

## 2016-10-10 DIAGNOSIS — Y939 Activity, unspecified: Secondary | ICD-10-CM | POA: Diagnosis not present

## 2016-10-10 DIAGNOSIS — S8002XA Contusion of left knee, initial encounter: Secondary | ICD-10-CM | POA: Diagnosis not present

## 2016-10-10 DIAGNOSIS — F17200 Nicotine dependence, unspecified, uncomplicated: Secondary | ICD-10-CM | POA: Insufficient documentation

## 2016-10-10 DIAGNOSIS — I10 Essential (primary) hypertension: Secondary | ICD-10-CM | POA: Insufficient documentation

## 2016-10-10 DIAGNOSIS — S63502A Unspecified sprain of left wrist, initial encounter: Secondary | ICD-10-CM

## 2016-10-10 DIAGNOSIS — F10920 Alcohol use, unspecified with intoxication, uncomplicated: Secondary | ICD-10-CM

## 2016-10-10 DIAGNOSIS — F10120 Alcohol abuse with intoxication, uncomplicated: Secondary | ICD-10-CM | POA: Insufficient documentation

## 2016-10-10 DIAGNOSIS — S5002XA Contusion of left elbow, initial encounter: Secondary | ICD-10-CM

## 2016-10-10 DIAGNOSIS — S161XXA Strain of muscle, fascia and tendon at neck level, initial encounter: Secondary | ICD-10-CM | POA: Diagnosis not present

## 2016-10-10 DIAGNOSIS — S60222A Contusion of left hand, initial encounter: Secondary | ICD-10-CM | POA: Diagnosis not present

## 2016-10-10 DIAGNOSIS — S0990XA Unspecified injury of head, initial encounter: Secondary | ICD-10-CM

## 2016-10-10 DIAGNOSIS — R079 Chest pain, unspecified: Secondary | ICD-10-CM | POA: Diagnosis not present

## 2016-10-10 DIAGNOSIS — Y999 Unspecified external cause status: Secondary | ICD-10-CM | POA: Diagnosis not present

## 2016-10-10 DIAGNOSIS — W108XXA Fall (on) (from) other stairs and steps, initial encounter: Secondary | ICD-10-CM | POA: Diagnosis not present

## 2016-10-10 DIAGNOSIS — Y929 Unspecified place or not applicable: Secondary | ICD-10-CM | POA: Insufficient documentation

## 2016-10-10 DIAGNOSIS — W19XXXA Unspecified fall, initial encounter: Secondary | ICD-10-CM

## 2016-10-10 LAB — CBC WITH DIFFERENTIAL/PLATELET
BASOS PCT: 0 %
Basophils Absolute: 0 10*3/uL (ref 0.0–0.1)
EOS PCT: 1 %
Eosinophils Absolute: 0.1 10*3/uL (ref 0.0–0.7)
HCT: 50.9 % (ref 39.0–52.0)
Hemoglobin: 17.8 g/dL — ABNORMAL HIGH (ref 13.0–17.0)
Lymphocytes Relative: 27 %
Lymphs Abs: 2 10*3/uL (ref 0.7–4.0)
MCH: 34.9 pg — ABNORMAL HIGH (ref 26.0–34.0)
MCHC: 35 g/dL (ref 30.0–36.0)
MCV: 99.8 fL (ref 78.0–100.0)
MONO ABS: 0.3 10*3/uL (ref 0.1–1.0)
Monocytes Relative: 5 %
Neutro Abs: 5 10*3/uL (ref 1.7–7.7)
Neutrophils Relative %: 67 %
PLATELETS: 225 10*3/uL (ref 150–400)
RBC: 5.1 MIL/uL (ref 4.22–5.81)
RDW: 13.5 % (ref 11.5–15.5)
WBC: 7.4 10*3/uL (ref 4.0–10.5)

## 2016-10-10 LAB — BASIC METABOLIC PANEL
Anion gap: 10 (ref 5–15)
BUN: 7 mg/dL (ref 6–20)
CALCIUM: 9.4 mg/dL (ref 8.9–10.3)
CO2: 25 mmol/L (ref 22–32)
Chloride: 106 mmol/L (ref 101–111)
Creatinine, Ser: 1.1 mg/dL (ref 0.61–1.24)
GFR calc Af Amer: >60 mL/min (ref >60)
GLUCOSE: 91 mg/dL (ref 65–99)
Potassium: 3.6 mmol/L (ref 3.5–5.1)
SODIUM: 141 mmol/L (ref 135–145)

## 2016-10-10 LAB — ETHANOL: Alcohol, Ethyl (B): 209 mg/dL — ABNORMAL HIGH (ref <5)

## 2016-10-10 MED ORDER — KETOROLAC TROMETHAMINE 30 MG/ML IJ SOLN
30.0000 mg | Freq: Once | INTRAMUSCULAR | Status: AC
  Administered 2016-10-10: 30 mg via INTRAVENOUS
  Filled 2016-10-10: qty 1

## 2016-10-10 MED ORDER — IOPAMIDOL (ISOVUE-300) INJECTION 61%
100.0000 mL | Freq: Once | INTRAVENOUS | Status: AC | PRN
  Administered 2016-10-10: 100 mL via INTRAVENOUS

## 2016-10-10 MED ORDER — SODIUM CHLORIDE 0.9 % IV BOLUS (SEPSIS)
1000.0000 mL | Freq: Once | INTRAVENOUS | Status: AC
  Administered 2016-10-10: 1000 mL via INTRAVENOUS

## 2016-10-10 MED ORDER — HYDROCODONE-ACETAMINOPHEN 5-325 MG PO TABS: 1.0000 | ORAL_TABLET | Freq: Four times a day (QID) | ORAL | 0 refills | Status: DC | PRN

## 2016-10-10 NOTE — ED Notes (Signed)
EDP into room, prior to RN assessment, see MD notes, pending orders.   
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
## 2016-10-10 NOTE — ED Provider Notes (Signed)
[facility] DEPT [facility] Provider Note   CSN: [id number] Arrival date & time: 10/10/16  0014  By signing my name below, I, Dolores Hoose, attest that this documentation has been prepared under the direction and in the presence of Veryl Speak, MD . Electronically Signed: Dolores Hoose, Scribe. 10/10/2016. 12:30 AM.  History   Chief Complaint Chief Complaint  Patient presents with  . Fall   The history is provided by the patient. No language interpreter was used.    HPI Comments:  Jonathan Barry is a 56 y.o. male with pmhx of HTN and GERD who presents to the Emergency Department complaining of constant, mild, left-sided arthralgias and myalgias s/p fall earlier tonight. Pt was at comedy show in [place] when he fell down a flight of stairs. He states that he was fine at the time and so he accepted a ride home to [place]. Pt notes that when he got out of his car his knee gave out on him and he fell again. He reports associated lower back pain, left hand pain and neck pain. His pain is exacerbated by certain positions. No medications taken PTA. Denies any weakness or numbness.   Past Medical History:  Diagnosis Date  . GERD (gastroesophageal reflux disease)   . Hypertension    off meds 3 months   There are no active problems to display for this patient.  Past Surgical History:  Procedure Laterality Date  . KNEE ARTHROSCOPY WITH ANTERIOR CRUCIATE LIGAMENT (ACL) REPAIR WITH HAMSTRING GRAFT Left 12/07/2013   Procedure: LEFT KNEE ARTHROSCOPY WITH ANTERIOR CRUCIATE LIGAMENT (ACL) REPAIR WITH DEBRIDEMENT/SHAVING (CHONDROPLASTY) WITH MENISCECTOMY MEDIAL AND LATERAL;  Surgeon: Renette Butters, MD;  Location: [facility];  Service: Orthopedics;  Laterality: Left;  . WRIST ARTHROPLASTY  2006   tendon and artery repairs-trama-left       Home Medications    Prior to Admission medications   Medication Sig Start Date End Date Taking? Authorizing Provider  aspirin 81 MG tablet  Take 1 tablet (81 mg total) by mouth daily. 12/07/13   Renette Butters, MD  Blood Pressure KIT by Does not apply route.    Historical Provider, MD  docusate sodium (COLACE) 100 MG capsule Take 1 capsule (100 mg total) by mouth 2 (two) times daily. 12/07/13   Renette Butters, MD  esomeprazole (NEXIUM) 40 MG capsule Take 40 mg by mouth daily at 12 noon.    Historical Provider, MD  HYDROcodone-acetaminophen (NORCO) 10-325 MG per tablet Take 2 tablets by mouth every 4 (four) hours as needed for severe pain.    Historical Provider, MD  meloxicam (MOBIC) 15 MG tablet Take 15 mg by mouth daily.    Historical Provider, MD  ondansetron (ZOFRAN) 4 MG tablet Take 1 tablet (4 mg total) by mouth every 8 (eight) hours as needed for nausea or vomiting. 12/07/13   Renette Butters, MD  oxyCODONE (OXY IR/ROXICODONE) 5 MG immediate release tablet Take 2 tablets (10 mg total) by mouth every 4 (four) hours as needed for severe pain. 12/07/13   Renette Butters, MD  oxyCODONE-acetaminophen (PERCOCET) 5-325 MG per tablet Take 1 tablet by mouth every 4 (four) hours as needed for moderate pain. 08/17/06   Delora Fuel, MD  oxyCODONE-acetaminophen (PERCOCET/ROXICET) 5-325 MG per tablet Take 2 tablets by mouth every 4 (four) hours as needed for severe pain. 11/04/13   Fransico Meadow, PA-C    Family History No family history on file.  Social History Social History  Substance  Use Topics  . Smoking status: Current Every Day Smoker    Packs/day: 0.50  . Smokeless tobacco: Not on file  . Alcohol use Yes     Comment: occ     Allergies   Patient has no known allergies.   Review of Systems Review of Systems  Musculoskeletal: Positive for arthralgias, back pain, myalgias and neck pain.  Neurological: Negative for weakness and numbness.     Physical Exam Updated Vital Signs BP (!) 131/93 (BP Location: Right Arm)   Pulse (!) 112   Temp 97.9 F (36.6 C) (Oral)   Resp 20   Ht _0  (1.676 m)   Wt 180 lb (81.6 kg)    SpO2 97%   BMI 29.05 kg/m   Physical Exam  Constitutional: He is oriented to person, place, and time. He appears well-developed and well-nourished.  Odor of alcohol is present.   HENT:  Head: Normocephalic and atraumatic.  Eyes: EOM are normal. Pupils are equal, round, and reactive to light.  Neck: Normal range of motion.  TTP in the soft tissues of the cervical region. No bony tenderness or step-off.   Cardiovascular: Normal rate, regular rhythm, normal heart sounds and intact distal pulses.   Pulmonary/Chest: Effort normal and breath sounds normal. No respiratory distress.  Abdominal: Soft. He exhibits no distension. There is no tenderness.  Musculoskeletal: Normal range of motion.  TTP of the left shoulder, left elbow, left wrist, and left hand. All appear grossly normal with no deformity, swelling. PMS intact to entire left hand.    TTP to the left hip, left thigh, left knee. All appear grossly normal with no swelling or deformity.    TTP in the soft tissues of the thoracic and lumbar region. No bony tenderness or step-offs.   Neurological: He is alert and oriented to person, place, and time. No cranial nerve deficit. He exhibits normal muscle tone. Coordination normal.  Skin: Skin is warm and dry.  Psychiatric: He has a normal mood and affect. Judgment normal.  Nursing note and vitals reviewed.    ED Treatments / Results  DIAGNOSTIC STUDIES:  Oxygen Saturation is 97% on RA, normal by my interpretation.    COORDINATION OF CARE:  12:34 AM Discussed treatment plan with pt at bedside which includes imaging of all areas where the pt is complaining of pain and pt agreed to plan.  Labs (all labs ordered are listed, but only abnormal results are displayed) Labs Reviewed - No data to display  EKG  EKG Interpretation None       Radiology No results found.  Procedures Procedures (including critical care time)  Medications Ordered in ED Medications - No data to  display   Initial Impression / Assessment and Plan / ED Course  I have reviewed the triage vital signs and the nursing notes.  Pertinent labs & imaging results that were available during my care of the patient were reviewed by me and considered in my medical decision making (see chart for details).  This patient is a 56 year old male who presents for evaluation of a fall. He reports he was attending a comedy club in [place] week when he fell down a flight of stairs while intoxicated. He was helped into the car, then driven home. When attempting to get out of the car, he apparently "collapsed". Was then brought here by his significant other complaining of pain in the "entire left side of his body".  The patient had to be basically lifted from the  wheelchair into his exam stretcher, then required maximum assistance to be rolled from the right lateral decubitus position to a supine position. His physical examination reveals no swelling, no abrasions, no contusions, but did show exquisite tenderness to his left shoulder, left elbow, left wrist, left hand, left hip, and left knee which was way out of proportion with any physical examination findings.  He was also complaining of pain in his chest, abdomen, head, and neck. This patient appeared highly intoxicated and had the strong odor of alcohol. For this reason, he underwent trauma imaging which also returned unremarkable. CT scan of the head, cervical spine, chest, abdomen, and pelvis showed no acute findings.  X-rays of the left upper extremity and left knee show only the suggestion of an avulsion fracture of his left medial femoral epicondyles, however there was no swelling or point tenderness in this area. I highly suspect these are old.  Patient will be discharged to home, to follow-up with his primary doctor for any problems.  Final Clinical Impressions(s) / ED Diagnoses   Final diagnoses:  None    New Prescriptions New Prescriptions   No  medications on file  I personally performed the services described in this documentation, which was scribed in my presence. The recorded information has been reviewed and is accurate.        Veryl Speak, MD 10/10/16 [phone]

## 2016-10-10 NOTE — ED Triage Notes (Signed)
PT presents to ED with c/o fall tonight down stairs and now has pain on the entire left side of his body

## 2016-10-10 NOTE — ED Notes (Signed)
Remains in radiology.

## 2016-10-10 NOTE — ED Notes (Signed)
No changes, EDP into room.

## 2016-10-10 NOTE — Discharge Instructions (Signed)
Hydrocodone as prescribed as needed for pain.  Ice for 20 minutes every 2 hours while awake for the next 2 days.  With your primary Dr. if not improving in the next 2-3 days.

## 2016-10-10 NOTE — ED Notes (Signed)
Alert, NAD, calm, interactive, resps e/u, speaking in clear complete sentences, no dyspnea noted, skin W&D, c/o continued pain, (denies: sob, nausea, dizziness or visual changes). Family at [facility].

## 2017-09-26 ENCOUNTER — Other Ambulatory Visit (HOSPITAL_BASED_OUTPATIENT_CLINIC_OR_DEPARTMENT_OTHER): Payer: Self-pay | Admitting: Internal Medicine

## 2017-09-26 ENCOUNTER — Ambulatory Visit (HOSPITAL_BASED_OUTPATIENT_CLINIC_OR_DEPARTMENT_OTHER)
Admission: RE | Admit: 2017-09-26 | Discharge: 2017-09-26 | Disposition: A | Discharge status: Home / Self Care | Payer: Medicare Other | Source: Ambulatory Visit | Attending: Internal Medicine | Admitting: Internal Medicine

## 2017-09-26 DIAGNOSIS — R05 Cough: Secondary | ICD-10-CM | POA: Insufficient documentation

## 2017-09-26 DIAGNOSIS — R0602 Shortness of breath: Secondary | ICD-10-CM

## 2017-09-26 DIAGNOSIS — R059 Cough, unspecified: Secondary | ICD-10-CM

## 2019-12-24 ENCOUNTER — Telehealth: Payer: Self-pay | Admitting: *Deleted

## 2019-12-24 NOTE — Telephone Encounter (Signed)
Thank you :)

## 2019-12-24 NOTE — Telephone Encounter (Signed)
Spoke with patient's wife Jonathan Barry regarding whether the pt went back on his Xarelto after canceling his EGD Friday afternoon. She says he started back on it Friday night. She also indicated that she would find out from [name] as to when they want the EGD to be rescheduled.

## 2019-12-25 ENCOUNTER — Telehealth: Payer: Self-pay | Admitting: *Deleted

## 2019-12-25 NOTE — Telephone Encounter (Signed)
There was an invalid note documented on this patient yesterday. Pt had the same name. Please disregard note on anticoagulation and EGD.

## 2020-11-26 ENCOUNTER — Other Ambulatory Visit: Payer: Self-pay

## 2020-11-26 ENCOUNTER — Encounter (HOSPITAL_COMMUNITY): Payer: Self-pay

## 2020-11-26 ENCOUNTER — Emergency Department (HOSPITAL_COMMUNITY)
Admission: EM | Admit: 2020-11-26 | Discharge: 2020-11-26 | Disposition: A | Discharge status: Home / Self Care | Payer: Medicare Other | Attending: Emergency Medicine | Admitting: Emergency Medicine

## 2020-11-26 DIAGNOSIS — R109 Unspecified abdominal pain: Secondary | ICD-10-CM | POA: Diagnosis present

## 2020-11-26 DIAGNOSIS — K219 Gastro-esophageal reflux disease without esophagitis: Secondary | ICD-10-CM | POA: Insufficient documentation

## 2020-11-26 DIAGNOSIS — I10 Essential (primary) hypertension: Secondary | ICD-10-CM | POA: Insufficient documentation

## 2020-11-26 DIAGNOSIS — K625 Hemorrhage of anus and rectum: Secondary | ICD-10-CM | POA: Diagnosis not present

## 2020-11-26 DIAGNOSIS — Z96632 Presence of left artificial wrist joint: Secondary | ICD-10-CM | POA: Diagnosis not present

## 2020-11-26 DIAGNOSIS — F17200 Nicotine dependence, unspecified, uncomplicated: Secondary | ICD-10-CM | POA: Insufficient documentation

## 2020-11-26 DIAGNOSIS — Z7982 Long term (current) use of aspirin: Secondary | ICD-10-CM | POA: Diagnosis not present

## 2020-11-26 LAB — COMPREHENSIVE METABOLIC PANEL
ALT: 12 U/L (ref 0–44)
AST: 14 U/L — ABNORMAL LOW (ref 15–41)
Albumin: 4.1 g/dL (ref 3.5–5.0)
Alkaline Phosphatase: 37 U/L — ABNORMAL LOW (ref 38–126)
Anion gap: 7 (ref 5–15)
BUN: 13 mg/dL (ref 6–20)
CO2: 28 mmol/L (ref 22–32)
Calcium: 9.7 mg/dL (ref 8.9–10.3)
Chloride: 100 mmol/L (ref 98–111)
Creatinine, Ser: 1.1 mg/dL (ref 0.61–1.24)
GFR, Estimated: >60 mL/min (ref >60)
Glucose, Bld: 96 mg/dL (ref 70–99)
Potassium: 4.1 mmol/L (ref 3.5–5.1)
Sodium: 135 mmol/L (ref 135–145)
Total Bilirubin: 0.8 mg/dL (ref 0.3–1.2)
Total Protein: 7.2 g/dL (ref 6.5–8.1)

## 2020-11-26 LAB — TYPE AND SCREEN
ABO/RH(D): B POS
Antibody Screen: NEGATIVE

## 2020-11-26 LAB — CBC WITH DIFFERENTIAL/PLATELET
Abs Immature Granulocytes: 0.01 10*3/uL (ref 0.00–0.07)
Basophils Absolute: 0 10*3/uL (ref 0.0–0.1)
Basophils Relative: 1 %
Eosinophils Absolute: 0.2 10*3/uL (ref 0.0–0.5)
Eosinophils Relative: 3 %
HCT: 45.8 % (ref 39.0–52.0)
Hemoglobin: 15.3 g/dL (ref 13.0–17.0)
Immature Granulocytes: 0 %
Lymphocytes Relative: 37 %
Lymphs Abs: 2.7 10*3/uL (ref 0.7–4.0)
MCH: 32.3 pg (ref 26.0–34.0)
MCHC: 33.4 g/dL (ref 30.0–36.0)
MCV: 96.8 fL (ref 80.0–100.0)
Monocytes Absolute: 0.5 10*3/uL (ref 0.1–1.0)
Monocytes Relative: 6 %
Neutro Abs: 3.9 10*3/uL (ref 1.7–7.7)
Neutrophils Relative %: 53 %
Platelets: 236 10*3/uL (ref 150–400)
RBC: 4.73 MIL/uL (ref 4.22–5.81)
RDW: 14.4 % (ref 11.5–15.5)
WBC: 7.3 10*3/uL (ref 4.0–10.5)
nRBC: 0 % (ref 0.0–0.2)

## 2020-11-26 LAB — LIPASE, BLOOD: Lipase: 28 U/L (ref 11–51)

## 2020-11-26 NOTE — ED Notes (Signed)
Pt walked out to car

## 2020-11-26 NOTE — ED Notes (Signed)
Colonoscopy performed approx 2 months ago  He was told he has internal hemorrhoids  Bleeding x2  In bms  No pain bp good

## 2020-11-26 NOTE — ED Triage Notes (Signed)
Patient complains of blood in stool following colonoscopy in March. Patient report 2 episodes of frank blood in stool, has told GI doctor about same and no further treatment. Patient reports mild abdominal pain today. No vomiting, alert and oriented.

## 2020-11-26 NOTE — ED Provider Notes (Signed)
Emergency Medicine Provider Triage Evaluation Note  Jonathan Barry , a 60 y.o. male  was evaluated in triage.  Pt complains of blood in stool. Has had 2 episodes since he had a colonoscopy 2 months ago, denies abd pain.  Review of Systems  Positive: Bloody stool Negative: abd pain  Physical Exam  BP 131/86 (BP Location: Right Arm)   Pulse 76   Temp 98.4 F (36.9 C)   Resp 17   SpO2 97%  Gen:   Awake, no distress   Resp:  Normal effort  MSK:   Moves extremities without difficulty  Other:  abd soft and nontender  Medical Decision Making  Medically screening exam initiated at 6:08 PM.  Appropriate orders placed.  Jonathan Barry was informed that the remainder of the evaluation will be completed by another provider, this initial triage assessment does not replace that evaluation, and the importance of remaining in the ED until their evaluation is complete.    Rayne Du 11/26/20 1811    Lorre Nick, MD 11/28/20 [phone]

## 2020-11-26 NOTE — ED Provider Notes (Signed)
[facility] EMERGENCY DEPARTMENT Provider Note   CSN: [id number] Arrival date & time: 11/26/20  1735     History No chief complaint on file.   Jonathan Barry is a 60 y.o. male.  HPI  60 year old male presents with blood in his stool.  He had a bloody bowel movement earlier today.  He had 1 similar about a week ago.  About 4 weeks ago at [facility] he had a colonoscopy which she states had 1 polyp and he was also later told he had hemorrhoids and could consider banding.  He is not sure if they meant internal or external.  He has no rectal pain.  He has been dealing with some nonspecific abdominal discomfort for quite some time but is not new or worse today.  No dizziness or lightheadedness.  He is not on blood thinners.  Past Medical History:  Diagnosis Date  . GERD (gastroesophageal reflux disease)   . Hypertension    off meds 3 months    There are no problems to display for this patient.   Past Surgical History:  Procedure Laterality Date  . KNEE ARTHROSCOPY WITH ANTERIOR CRUCIATE LIGAMENT (ACL) REPAIR WITH HAMSTRING GRAFT Left 12/07/2013   Procedure: LEFT KNEE ARTHROSCOPY WITH ANTERIOR CRUCIATE LIGAMENT (ACL) REPAIR WITH DEBRIDEMENT/SHAVING (CHONDROPLASTY) WITH MENISCECTOMY MEDIAL AND LATERAL;  Surgeon: Renette Butters, MD;  Location: [facility];  Service: Orthopedics;  Laterality: Left;  . WRIST ARTHROPLASTY  2006   tendon and artery repairs-trama-left       No family history on file.  Social History   Tobacco Use  . Smoking status: Current Every Day Smoker    Packs/day: 0.50  Substance Use Topics  . Alcohol use: Yes    Comment: occ  . Drug use: No    Home Medications Prior to Admission medications   Medication Sig Start Date End Date Taking? Authorizing Provider  aspirin 81 MG tablet Take 1 tablet (81 mg total) by mouth daily. 12/07/13   Renette Butters, MD  Blood Pressure KIT by Does not apply route.    [provider]  docusate sodium (COLACE) 100 MG capsule Take 1 capsule (100 mg total) by mouth 2 (two) times daily. 12/07/13   Renette Butters, MD  esomeprazole (NEXIUM) 40 MG capsule Take 40 mg by mouth daily at 12 noon.    [provider]  HYDROcodone-acetaminophen (NORCO/VICODIN) 5-325 MG tablet Take 1-2 tablets by mouth every 6 (six) hours as needed. 10/10/16   Veryl Speak, MD  meloxicam (MOBIC) 15 MG tablet Take 15 mg by mouth daily.    [provider]  ondansetron (ZOFRAN) 4 MG tablet Take 1 tablet (4 mg total) by mouth every 8 (eight) hours as needed for nausea or vomiting. 12/07/13   Renette Butters, MD  oxyCODONE (OXY IR/ROXICODONE) 5 MG immediate release tablet Take 2 tablets (10 mg total) by mouth every 4 (four) hours as needed for severe pain. 12/07/13   Renette Butters, MD  oxyCODONE-acetaminophen (PERCOCET) 5-325 MG per tablet Take 1 tablet by mouth every 4 (four) hours as needed for moderate pain. 10/12/79   Delora Fuel, MD  oxyCODONE-acetaminophen (PERCOCET/ROXICET) 5-325 MG per tablet Take 2 tablets by mouth every 4 (four) hours as needed for severe pain. 11/04/13   Fransico Meadow, PA-C    Allergies    Patient has no known allergies.  Review of Systems   Review of Systems  Respiratory: Negative for shortness of  breath.   Gastrointestinal: Positive for abdominal pain and blood in stool. Negative for rectal pain.  Neurological: Negative for light-headedness.  All other systems reviewed and are negative.   Physical Exam Updated Vital Signs BP 131/86 (BP Location: Right Arm)   Pulse 76   Temp 98.4 F (36.9 C)   Resp 17   SpO2 97%   Physical Exam Vitals and nursing note reviewed.  Constitutional:      Appearance: He is well-developed.  HENT:     Head: Normocephalic and atraumatic.     Right Ear: External ear normal.     Left Ear: External ear normal.     Nose: Nose normal.  Eyes:     General:        Right eye: No discharge.        Left eye: No  discharge.  Cardiovascular:     Rate and Rhythm: Normal rate and regular rhythm.     Heart sounds: Normal heart sounds.  Pulmonary:     Effort: Pulmonary effort is normal.     Breath sounds: Normal breath sounds.  Abdominal:     Palpations: Abdomen is soft.     Tenderness: There is abdominal tenderness (vague, mild, non localized).  Genitourinary:    Rectum: No mass, tenderness, anal fissure or external hemorrhoid.     Comments: No gross blood or melena on DRE. Light brown stool Musculoskeletal:     Cervical back: Neck supple.  Skin:    General: Skin is warm and dry.  Neurological:     Mental Status: He is alert.  Psychiatric:        Mood and Affect: Mood is not anxious.     ED Results / Procedures / Treatments   Labs (all labs ordered are listed, but only abnormal results are displayed) Labs Reviewed  COMPREHENSIVE METABOLIC PANEL - Abnormal; Notable for the following components:      Result Value   AST 14 (*)    Alkaline Phosphatase 37 (*)    All other components within normal limits  CBC WITH DIFFERENTIAL/PLATELET  LIPASE, BLOOD  TYPE AND SCREEN  ABO/RH    EKG None  Radiology No results found.  Procedures Procedures   Medications Ordered in ED Medications - No data to display  ED Course  I have reviewed the triage vital signs and the nursing notes.  Pertinent labs & imaging results that were available during my care of the patient were reviewed by me and considered in my medical decision making (see chart for details).    MDM Rules/Calculators/A&P                          No current bleeding on rectal exam.  I do not feel any obvious hemorrhoids and there is no external hemorrhoids.  Given his presentation he probably has some internal hemorrhoids that had bled earlier today and last week.  However no signs of significant bleeding with a normal hemoglobin of 15 and I think is reasonable to discharge him home to call his gastroenterologist tomorrow to  consider outpatient banding if warranted.  We discussed return precautions but at this point he does not appear to need observation or emergent GI consultation.  He has some vague abdominal discomfort but he also has been dealing with some abdominal discomfort for quite some time and so I do not think emergent CT is warranted.  I highly doubt acute arterial or emergent bleeding Final Clinical  Impression(s) / ED Diagnoses Final diagnoses:  Rectal bleeding    Rx / DC Orders ED Discharge Orders    None       Sherwood Gambler, MD 11/26/20 2143

## 2020-11-26 NOTE — Discharge Instructions (Signed)
If you develop new or worsening bleeding, abdominal pain, dizziness, lightheadedness, or any other new/concerning symptoms then return to the ER for evaluation.

## 2022-03-10 ENCOUNTER — Other Ambulatory Visit (HOSPITAL_COMMUNITY): Payer: Self-pay | Admitting: Family Medicine

## 2022-03-10 DIAGNOSIS — R0602 Shortness of breath: Secondary | ICD-10-CM

## 2022-03-26 ENCOUNTER — Telehealth (HOSPITAL_COMMUNITY): Payer: Self-pay | Admitting: Emergency Medicine

## 2022-03-26 DIAGNOSIS — R079 Chest pain, unspecified: Secondary | ICD-10-CM

## 2022-03-26 MED ORDER — METOPROLOL TARTRATE 100 MG PO TABS: 100.0000 mg | ORAL_TABLET | Freq: Once | ORAL | 0 refills | Status: AC

## 2022-03-26 NOTE — Telephone Encounter (Signed)
Reaching out to patient to offer assistance regarding upcoming cardiac imaging study; pt verbalizes understanding of appt date/time, parking situation and where to check in, pre-test NPO status and medications ordered, and verified current allergies; name and call back number provided for further questions should they arise Marchia Bond RN Navigator Cardiac Imaging [facility] [phone] office [phone] cell  Arrival 200 w/c entrnace 100mg  metoprolol  Denies iv issues Aware nitro

## 2022-03-29 ENCOUNTER — Ambulatory Visit (HOSPITAL_COMMUNITY)
Admission: RE | Admit: 2022-03-29 | Discharge: 2022-03-29 | Disposition: A | Discharge status: Home / Self Care | Payer: Medicare Other | Source: Ambulatory Visit | Attending: Family Medicine | Admitting: Family Medicine

## 2022-03-29 DIAGNOSIS — R0602 Shortness of breath: Secondary | ICD-10-CM | POA: Insufficient documentation

## 2022-03-29 DIAGNOSIS — R0600 Dyspnea, unspecified: Secondary | ICD-10-CM | POA: Diagnosis not present

## 2022-03-29 DIAGNOSIS — I10 Essential (primary) hypertension: Secondary | ICD-10-CM | POA: Diagnosis not present

## 2022-03-29 DIAGNOSIS — R079 Chest pain, unspecified: Secondary | ICD-10-CM | POA: Diagnosis not present

## 2022-03-29 DIAGNOSIS — F17200 Nicotine dependence, unspecified, uncomplicated: Secondary | ICD-10-CM | POA: Insufficient documentation

## 2022-03-29 MED ORDER — IOHEXOL 350 MG/ML SOLN
95.0000 mL | Freq: Once | INTRAVENOUS | Status: AC | PRN
  Administered 2022-03-29: 95 mL via INTRAVENOUS

## 2022-03-29 MED ORDER — NITROGLYCERIN 0.4 MG SL SUBL
SUBLINGUAL_TABLET | SUBLINGUAL | Status: AC
  Filled 2022-03-29: qty 2

## 2022-03-29 MED ORDER — NITROGLYCERIN 0.4 MG SL SUBL
0.8000 mg | SUBLINGUAL_TABLET | Freq: Once | SUBLINGUAL | Status: AC
  Administered 2022-03-29: 0.8 mg via SUBLINGUAL

## 2022-07-12 ENCOUNTER — Other Ambulatory Visit (HOSPITAL_BASED_OUTPATIENT_CLINIC_OR_DEPARTMENT_OTHER): Payer: Self-pay

## 2022-10-16 ENCOUNTER — Other Ambulatory Visit (HOSPITAL_COMMUNITY): Payer: Self-pay | Admitting: Cardiovascular Disease

## 2022-10-16 DIAGNOSIS — R079 Chest pain, unspecified: Secondary | ICD-10-CM

## 2022-10-18 NOTE — Telephone Encounter (Signed)
No cardiology encounter notes for pt. Called pt to discuss med request, NA

## 2023-09-08 ENCOUNTER — Ambulatory Visit (INDEPENDENT_AMBULATORY_CARE_PROVIDER_SITE_OTHER): Payer: 59 | Admitting: Neurology

## 2023-09-08 VITALS — BP 132/74 | HR 94 | Ht 67.0 in | Wt 178.0 lb

## 2023-09-08 DIAGNOSIS — F17200 Nicotine dependence, unspecified, uncomplicated: Secondary | ICD-10-CM

## 2023-09-08 DIAGNOSIS — H539 Unspecified visual disturbance: Secondary | ICD-10-CM

## 2023-09-08 DIAGNOSIS — G4734 Idiopathic sleep related nonobstructive alveolar hypoventilation: Secondary | ICD-10-CM | POA: Diagnosis not present

## 2023-09-08 DIAGNOSIS — R51 Headache with orthostatic component, not elsewhere classified: Secondary | ICD-10-CM

## 2023-09-08 DIAGNOSIS — J9612 Chronic respiratory failure with hypercapnia: Secondary | ICD-10-CM

## 2023-09-08 DIAGNOSIS — R519 Headache, unspecified: Secondary | ICD-10-CM

## 2023-09-08 DIAGNOSIS — R0689 Other abnormalities of breathing: Secondary | ICD-10-CM | POA: Diagnosis not present

## 2023-09-08 DIAGNOSIS — J449 Chronic obstructive pulmonary disease, unspecified: Secondary | ICD-10-CM

## 2023-09-08 NOTE — Patient Instructions (Addendum)
 MRI of the brain w/wo contrast blood work (kidney function) Overnight oxygen monitor  Yes, sleeping in a slightly upright position can help improve oxygenation in people with COPD by reducing the strain on their lungs and making breathing easier.  Here's a more detailed explanation: Reduced Strain on Lungs: Lying flat can make it harder to breathe, especially for those with COPD, as it can put pressure on the lungs and diaphragm.  Easier Breathing: Elevating the head and upper body can help open up the airways and make it easier to breathe, especially during sleep when breathing tends to become shallower.  Improved Oxygen Saturation: A slightly upright position can lead to better oxygen saturation levels in the blood, which is crucial for people with COPD who often struggle with low oxygen levels.   In patients with COPD, morning headaches can be a sign of nocturnal hypoventilation and carbon dioxide retention, potentially leading to daytime symptoms like fatigue and difficulty concentrating.  Here's a more detailed explanation: What is Hypoventilation? Hypoventilation refers to breathing that is too slow or shallow, leading to a buildup of carbon dioxide (hypercapnia) and potentially a decrease in oxygen levels (hypoxemia). It can occur during sleep, leading to sleep-related hypoventilation.  Why Morning Headaches in COPD? COPD and Sleep: COPD patients are at risk for sleep-related breathing disorders, including hypoventilation, which can worsen during sleep.  Hypercapnia and Hypoxemia: During sleep, reduced breathing effort in COPD can lead to a buildup of carbon dioxide and a decrease in oxygen levels.  Symptoms: These changes can cause or worsen symptoms like morning headaches, fatigue, and difficulty concentrating.  Other Symptoms: Other symptoms of sleep-related hypoventilation in COPD can include excessive daytime sleepiness, restless sleep, and waking up gasping or choking.  COPD  and Hypoventilation Syndromes Obesity Hypoventilation Syndrome (OHS): This syndrome is characterized by obesity, chronic hypercapnia, and sleep-disordered breathing.  COPD-OSAHS Overlap: COPD patients can also have obstructive sleep apnea-hypopnea syndrome (OSAHS), which can worsen hypoventilation and lead to a more severe conditio

## 2023-09-08 NOTE — Progress Notes (Signed)
 [facility]    Provider:  Dr Lucia Gaskins Requesting Provider: Frederich Chick., MD Primary Care Provider:  Frederich Chick., MD  CC:  headaches  HPI:  Jonathan Barry is a 63 y.o. male here as requested by Frederich Chick., MD for " bifrontal headaches, they are there when he wakes up and are getting worse, not suggestive of a vascular etiology, no daytime sleepiness suggesting OSA". Started prior to 6 months ago and now getting worse. Right when waking up. Open eyes, pounding/throbbing in the teamples. Wife doesn't complain about snoring. No inciting events. SLowly progressive and speeding up. He doesn't drink much coffee. Happens every morning now. When he first wakes up supine it is worse, when he gets up it slowly goes away while upright and comes back the next morning even before he gets out of bed. He does not wake up often. He goes to sleep 3-4am and wakes up about 11-12. Does not fall asleep during the day and does not doze off. He is fatigued during the day. Also reports decreased concentration during the day. Never gets headache any other time. This is new, worsening/progressive in frequencing and severity. He has never had headaches/migraines. He has a hx of COPD and he has an inhaler. He has fatigue and difficulty concentrating. No sedating medication at night. He is still smoking and has COPD per patient. +chronic Neck pain. +Vision changes. No other focal neurologic deficits, associated symptoms, inciting events or modifiable factors.No fevers. No meningismus. No sensory or gait disturbance. Headaches can be severe and worsening in freq and severity.   Reviewed notes, labs and imaging from outside physicians, which showed:  09/13/2022: TSH nml 12/13/2022: Creat 1, BUN 12  10/2016: EXAM: CT HEAD WITHOUT CONTRAST   CT CERVICAL SPINE WITHOUT CONTRAST   TECHNIQUE: Multidetector CT imaging of the head and cervical spine was performed following the standard  protocol without intravenous contrast. Multiplanar CT image reconstructions of the cervical spine were also generated.   COMPARISON:  06/30/2015   FINDINGS: CT HEAD FINDINGS   Brain: No evidence of acute infarction, hemorrhage, hydrocephalus, extra-axial collection or mass lesion/mass effect.   Vascular: Minimal atherosclerosis of the cavernous sinus carotids. No hyperdense vessels.   Skull: Normal. Negative for fracture or focal lesion.   Sinuses/Orbits: No acute finding.   Other: None.   CT CERVICAL SPINE FINDINGS   Alignment: Reversal cervical lordosis possibly from muscle spasm from positioning. Intact atlantodental interval. Craniocervical relationship appears intact.   Skull base and vertebrae: No acute fracture. No primary bone lesion or focal pathologic process.   Soft tissues and spinal canal: No prevertebral fluid or swelling. No visible canal hematoma.   Disc levels: Mild degenerative disc space narrowing at C4-5 and C5-6 with small posterior marginal osteophytes. No significant bony canal stenosis. Uncovertebral joint spurring from osteoarthritis noted bilaterally at C3-4, C4-5 and C5-6. These contribute to minimal encroachment of the neural foramina bilaterally.   Upper chest: Negative.   Other: None   IMPRESSION: 1. No acute intracranial abnormality. 2. 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 3. No acute posttraumatic fracture or subluxation of the cervical spine.  Review of Systems: Patient complains of symptoms per HPI as well as the following symptoms none. Pertinent negatives and positives per HPI. All others negative.   Social History   Socioeconomic History   Marital status: Single    Spouse name: Not on file   Number of  children: Not on file   Years of education: Not on file   Highest education level: Not on file  Occupational History   Not on file  Tobacco Use    Smoking status: Every Day    Current packs/day: 0.50    Types: Cigarettes   Smokeless tobacco: Not on file  Substance and Sexual Activity   Alcohol use: Yes    Comment: occ   Drug use: No   Sexual activity: Not on file  Other Topics Concern   Not on file  Social History Narrative   Not on file   Social Drivers of Health   Financial Resource Strain: Not on file  Food Insecurity: Not on file  Transportation Needs: Not on file  Physical Activity: Not on file  Stress: Not on file  Social Connections: Not on file  Intimate Partner Violence: Not on file    Family History  Problem Relation Age of Onset   Headache Neg Hx     Past Medical History:  Diagnosis Date   GERD (gastroesophageal reflux disease)    Hypertension    off meds 3 months    Patient Active Problem List   Diagnosis Date Noted   Morning headache 09/11/2023   Chronic obstructive pulmonary disease (HCC) 09/11/2023   Current smoker 09/11/2023    Past Surgical History:  Procedure Laterality Date   KNEE ARTHROSCOPY WITH ANTERIOR CRUCIATE LIGAMENT (ACL) REPAIR WITH HAMSTRING GRAFT Left 12/07/2013   Procedure: LEFT KNEE ARTHROSCOPY WITH ANTERIOR CRUCIATE LIGAMENT (ACL) REPAIR WITH DEBRIDEMENT/SHAVING (CHONDROPLASTY) WITH MENISCECTOMY MEDIAL AND LATERAL;  Surgeon: Sheral Apley, MD;  Location: [facility];  Service: Orthopedics;  Laterality: Left;   WRIST ARTHROPLASTY  2006   tendon and artery repairs-trama-left    Current Outpatient Medications  Medication Sig Dispense Refill   amLODipine (NORVASC) 10 MG tablet Take 10 mg by mouth daily.     Blood Pressure KIT by Does not apply route.     meloxicam (MOBIC) 15 MG tablet Take 15 mg by mouth daily.     oxyCODONE (OXY IR/ROXICODONE) 5 MG immediate release tablet Take 2 tablets (10 mg total) by mouth every 4 (four) hours as needed for severe pain. 90 tablet 0   metoprolol tartrate (LOPRESSOR) 100 MG tablet Take 1 tablet (100 mg total) by mouth once  for 1 dose. Please take one time dose 100mg  metoprolol tartrate 2 hr prior to cardiac CT for HR control IF HR >55bpm. 1 tablet 0   No current facility-administered medications for this visit.    Allergies as of 09/08/2023   (No Known Allergies)    Vitals: BP 132/74 (BP Location: Right Arm, Patient Position: Sitting, Cuff Size: Normal)   Pulse 94   Ht 5\' 7"  (1.702 m)   Wt 178 lb (80.7 kg)   BMI 27.88 kg/m  Last Weight:  Wt Readings from Last 1 Encounters:  09/08/23 178 lb (80.7 kg)   Last Height:   Ht Readings from Last 1 Encounters:  09/08/23 5\' 7"  (1.702 m)     Physical exam: Exam: Gen: NAD, conversant, well nourised, obese, well groomed                     CV: RRR, no MRG. No Carotid Bruits. No peripheral edema, warm, nontender Eyes: Conjunctivae clear without exudates or hemorrhage  Neuro: Detailed Neurologic Exam  Speech:    Speech is normal; fluent and spontaneous with normal comprehension.  Cognition:    The patient  is oriented to person, place, and time;     recent and remote memory intact;     language fluent;     normal attention, concentration,     fund of knowledge Cranial Nerves:    The pupils are equal, round, and reactive to light. Pupils too small to visualize fundi, attempted.  Visual fields are full to finger confrontation. Extraocular movements are intact. Trigeminal sensation is intact and the muscles of mastication are normal. The face is symmetric. The palate elevates in the midline. Hearing intact. Voice is normal. Shoulder shrug is normal. The tongue has normal motion without fasciculations.   Coordination: nml  Gait: nml  Motor Observation:    No asymmetry, no atrophy, and no involuntary movements noted. Tone:    Normal muscle tone.    Posture:    Posture is normal. normal erect    Strength:    Strength is V/V in the upper and lower limbs.      Sensation: intact to LT     Reflex Exam:  DTR's:    Deep tendon reflexes in the  upper and lower extremities are symmetrical bilaterally.   Toes:    The toes are downgoing bilaterally.   Clonus:    Clonus is absent.    Assessment/Plan:  22 patient with COPD, still smoking, > 6 months ago with worsening morning headaches right when waking up. Open eyes, pounding/throbbing in the teamples. He does not report snoring, but I suspect  his morning headaches can be a sign of nocturnal hypoventilation and carbon dioxide retention secondary to his COPD. But also needs further evaluation due to concerning symptoms:  - Suspicion for morning headaches from nocturnal hypoventilation and carbon dioxide retention secondary to his COPD: ONO overnight and then if confirmed needs to be sent to pulmonology  - Discussed other sleep-related causes such as poor quality sleep, sleep apnea (does not snore or wake often but he does report daytime fatigue and poor concentration), sleeping position  - MRI brain due to concerning symptoms of morning headaches, positional headaches,vision changes, worsening headaches  to look for space occupying mass, , strokes, malignancies, vasculidities, demyelination or other  - Discussed will get ONO, if confirmed may need O2 overnight or sleep test with pulmonary, also in the meantime sleeping in a slightly upright position can help improve oxygenation in people with COPD by reducing the strain on their lungs and making breathing easier.   Orders Placed This Encounter  Procedures   MR BRAIN W WO CONTRAST   Basic Metabolic Panel   No orders of the defined types were placed in this encounter.   Cc: Frederich Chick., MD,  Frederich Chick., MD  Naomie Dean, MD  [facility] [address] [place], [place] [address]  Phone [phone] Fax [phone]

## 2023-09-09 LAB — BASIC METABOLIC PANEL
BUN/Creatinine Ratio: 8 — ABNORMAL LOW (ref 10–24)
BUN: 10 mg/dL (ref 8–27)
CO2: 23 mmol/L (ref 20–29)
Calcium: 9.7 mg/dL (ref 8.6–10.2)
Chloride: 106 mmol/L (ref 96–106)
Creatinine, Ser: 1.19 mg/dL (ref 0.76–1.27)
Glucose: 90 mg/dL (ref 70–99)
Potassium: 4.7 mmol/L (ref 3.5–5.2)
Sodium: 144 mmol/L (ref 134–144)
eGFR: 69 mL/min/{1.73_m2} (ref >59)

## 2023-09-11 ENCOUNTER — Encounter: Payer: Self-pay | Admitting: Neurology

## 2023-09-11 DIAGNOSIS — F17200 Nicotine dependence, unspecified, uncomplicated: Secondary | ICD-10-CM | POA: Insufficient documentation

## 2023-09-11 DIAGNOSIS — R519 Headache, unspecified: Secondary | ICD-10-CM | POA: Insufficient documentation

## 2023-09-11 DIAGNOSIS — J449 Chronic obstructive pulmonary disease, unspecified: Secondary | ICD-10-CM | POA: Insufficient documentation

## 2023-09-12 ENCOUNTER — Telehealth: Payer: Self-pay | Admitting: *Deleted

## 2023-09-12 ENCOUNTER — Telehealth: Payer: Self-pay | Admitting: Neurology

## 2023-09-12 DIAGNOSIS — J9612 Chronic respiratory failure with hypercapnia: Secondary | ICD-10-CM

## 2023-09-12 DIAGNOSIS — R519 Headache, unspecified: Secondary | ICD-10-CM

## 2023-09-12 DIAGNOSIS — R0689 Other abnormalities of breathing: Secondary | ICD-10-CM

## 2023-09-12 DIAGNOSIS — G4734 Idiopathic sleep related nonobstructive alveolar hypoventilation: Secondary | ICD-10-CM

## 2023-09-12 DIAGNOSIS — J449 Chronic obstructive pulmonary disease, unspecified: Secondary | ICD-10-CM

## 2023-09-12 NOTE — Telephone Encounter (Signed)
 Jonathan Lush, RN; Jonathan Barry; Jonathan Barry; Jonathan Barry, Jonathan Barry; 1 other Received, Thank you

## 2023-09-12 NOTE — Telephone Encounter (Signed)
 ONO order sent to [facility]. I called the patient and let him know to watch out for a call from [employer] and if he doesn't hear within 1 week, please let us know. He thanked me for the call. He did not have any questions at the time of the call.

## 2023-09-12 NOTE — Telephone Encounter (Signed)
 Received: Tish Frederickson, Griselda Miner, MD  [facility] Calls Please order ONO for patient and confirm, he does not use oxygen at night,  Suspicion for morning headaches from nocturnal hypoventilation and carbon dioxide retention secondary to his COPD: ONO overnight and then if confirmed needs to be sent to pulmonology

## 2023-09-12 NOTE — Telephone Encounter (Signed)
 no auth required sent to [facility] [phone]

## 2023-09-13 ENCOUNTER — Telehealth: Payer: Self-pay | Admitting: *Deleted

## 2023-09-13 NOTE — Telephone Encounter (Signed)
 Called to speak with pt. A lady answered the phone. She said she would have the patient call us back. She had just been in "a little accident". She said she was ok.

## 2023-09-13 NOTE — Telephone Encounter (Signed)
-----   Message from Anson Fret sent at 09/11/2023 10:22 AM EDT ----- Bmp unremarkable thank you

## 2023-09-15 NOTE — Telephone Encounter (Signed)
 Spoke with patient and let him know his BMP is unremarkable, needed to check before doing contrast for MRI. He is awaiting a call from [facility]. I gave him their number if needed. Pt's questions were answered. He has ONO scheduled already. He thanked me for the call.

## 2023-11-24 NOTE — Telephone Encounter (Signed)
 Jonathan Barry

## 2023-12-12 NOTE — Telephone Encounter (Signed)
 Reviewed oxygenation test with Dr. Albertina Hugger, there was a period of decreased oxygenation but may be artifiact unclear. If he is still having morning headaches we can offer to send him for sleep evaluation. Also we order an MRI brain 09/11/2023 does not appear he had it completed just something to mention. thank you

## 2023-12-12 NOTE — Addendum Note (Signed)
 Addended by: Burns Carwin on: 12/12/2023 05:28 PM   Modules accepted: Orders

## 2023-12-12 NOTE — Telephone Encounter (Signed)
 Spoke with patient and relayed message from Dr. Tresia Fruit regarding ONO results.  The patient is amenable to a sleep referral as he does still have morning headaches.  He will watch for a call to schedule a consultation.  He also said he is ready to schedule the MRI brain.  He is going to call [facility] imaging to schedule this. Upon review of chart, they had called the patient but he was not ready to schedule yet.  Referral placed for sleep consult with Dr Albertina Hugger.

## 2023-12-13 ENCOUNTER — Telehealth: Payer: Self-pay | Admitting: Neurology

## 2023-12-13 NOTE — Telephone Encounter (Signed)
 Referral for sleep studies sent through [facility] to [facility].

## 2024-01-18 ENCOUNTER — Ambulatory Visit
Admission: RE | Admit: 2024-01-18 | Discharge: 2024-01-18 | Disposition: A | Discharge status: Home / Self Care | Source: Ambulatory Visit | Attending: Neurology

## 2024-01-18 DIAGNOSIS — J9612 Chronic respiratory failure with hypercapnia: Secondary | ICD-10-CM

## 2024-01-18 DIAGNOSIS — G4734 Idiopathic sleep related nonobstructive alveolar hypoventilation: Secondary | ICD-10-CM

## 2024-01-18 DIAGNOSIS — R51 Headache with orthostatic component, not elsewhere classified: Secondary | ICD-10-CM

## 2024-01-18 DIAGNOSIS — R0689 Other abnormalities of breathing: Secondary | ICD-10-CM

## 2024-01-18 DIAGNOSIS — R519 Headache, unspecified: Secondary | ICD-10-CM

## 2024-01-18 DIAGNOSIS — H539 Unspecified visual disturbance: Secondary | ICD-10-CM

## 2024-01-18 MED ORDER — GADOPICLENOL 0.5 MMOL/ML IV SOLN
7.5000 mL | Freq: Once | INTRAVENOUS | Status: AC | PRN
  Administered 2024-01-18: 7.5 mL via INTRAVENOUS

## 2024-01-23 ENCOUNTER — Ambulatory Visit: Payer: Self-pay | Admitting: Neurology

## 2024-01-23 ENCOUNTER — Encounter: Payer: Self-pay | Admitting: Neurology

## 2024-01-23 ENCOUNTER — Institutional Professional Consult (permissible substitution): Admitting: Neurology

## 2024-01-24 NOTE — Telephone Encounter (Signed)
-----   Message from Onetha KATHEE Epp sent at 01/23/2024  5:50 PM EDT ----- MRI of the brain was essentiall normal for age. However it did show chronic severe left maxillary sinusitis and if he is symptomatic of sinus disease I would follow up with primary care. But  otherwise MRI brain looks fine. Thank you ----- Message ----- From: Rosemarie Eather RAMAN, MD Sent: 01/18/2024   3:35 PM EDT To: Onetha KATHEE Epp, MD

## 2024-01-24 NOTE — Telephone Encounter (Signed)
 Spoke with patient and discussed his MRI brain results as noted below. The patient verbalized understanding. He said he will be seeing PCP, Dr Jerrye, next Monday and he would like the results sent to him so he can go over them. He thanked me for the call.   MRI brain results sent to Dr Jerrye.
